# Patient Record
Sex: Male | Born: 1950
Health system: Southern US, Community
[De-identification: ages and names within clinical notes are randomized; demographics above are authoritative.]

## PROBLEM LIST (undated history)

## (undated) DIAGNOSIS — Z87442 Personal history of urinary calculi: Secondary | ICD-10-CM

## (undated) DIAGNOSIS — E7800 Pure hypercholesterolemia, unspecified: Secondary | ICD-10-CM

## (undated) DIAGNOSIS — M1990 Unspecified osteoarthritis, unspecified site: Secondary | ICD-10-CM

## (undated) DIAGNOSIS — K219 Gastro-esophageal reflux disease without esophagitis: Secondary | ICD-10-CM

## (undated) DIAGNOSIS — A6000 Herpesviral infection of urogenital system, unspecified: Secondary | ICD-10-CM

## (undated) DIAGNOSIS — I10 Essential (primary) hypertension: Secondary | ICD-10-CM

## (undated) HISTORY — PX: VASECTOMY: SHX75

## (undated) HISTORY — DX: Unspecified osteoarthritis, unspecified site: M19.90

## (undated) HISTORY — DX: Gastro-esophageal reflux disease without esophagitis: K21.9

## (undated) HISTORY — DX: Herpesviral infection of urogenital system, unspecified: A60.00

## (undated) HISTORY — DX: Essential (primary) hypertension: I10

---

## 2004-05-27 ENCOUNTER — Encounter: Admission: RE | Admit: 2004-05-27 | Discharge: 2004-05-27 | Payer: Self-pay | Admitting: Family Medicine

## 2005-11-10 IMAGING — CR DG WRIST COMPLETE 3+V*L*
4 series · 4 of 4 positions shown · non-contrast
Comparison: none

CLINICAL DATA: Left wrist pain after exercise injury.
LEFT WRIST COMPLETE (FOUR VIEWS):
Four views of the left wrist show no definite fracture, dislocation or radiopaque foreign body.  iews of the wrist, as well as a special navicular view were made.
IMPRESSION
No fracture or dislocation left wrist.  These plain films do not rule out ligamentous injury.

[view not recorded (1 of 4)]
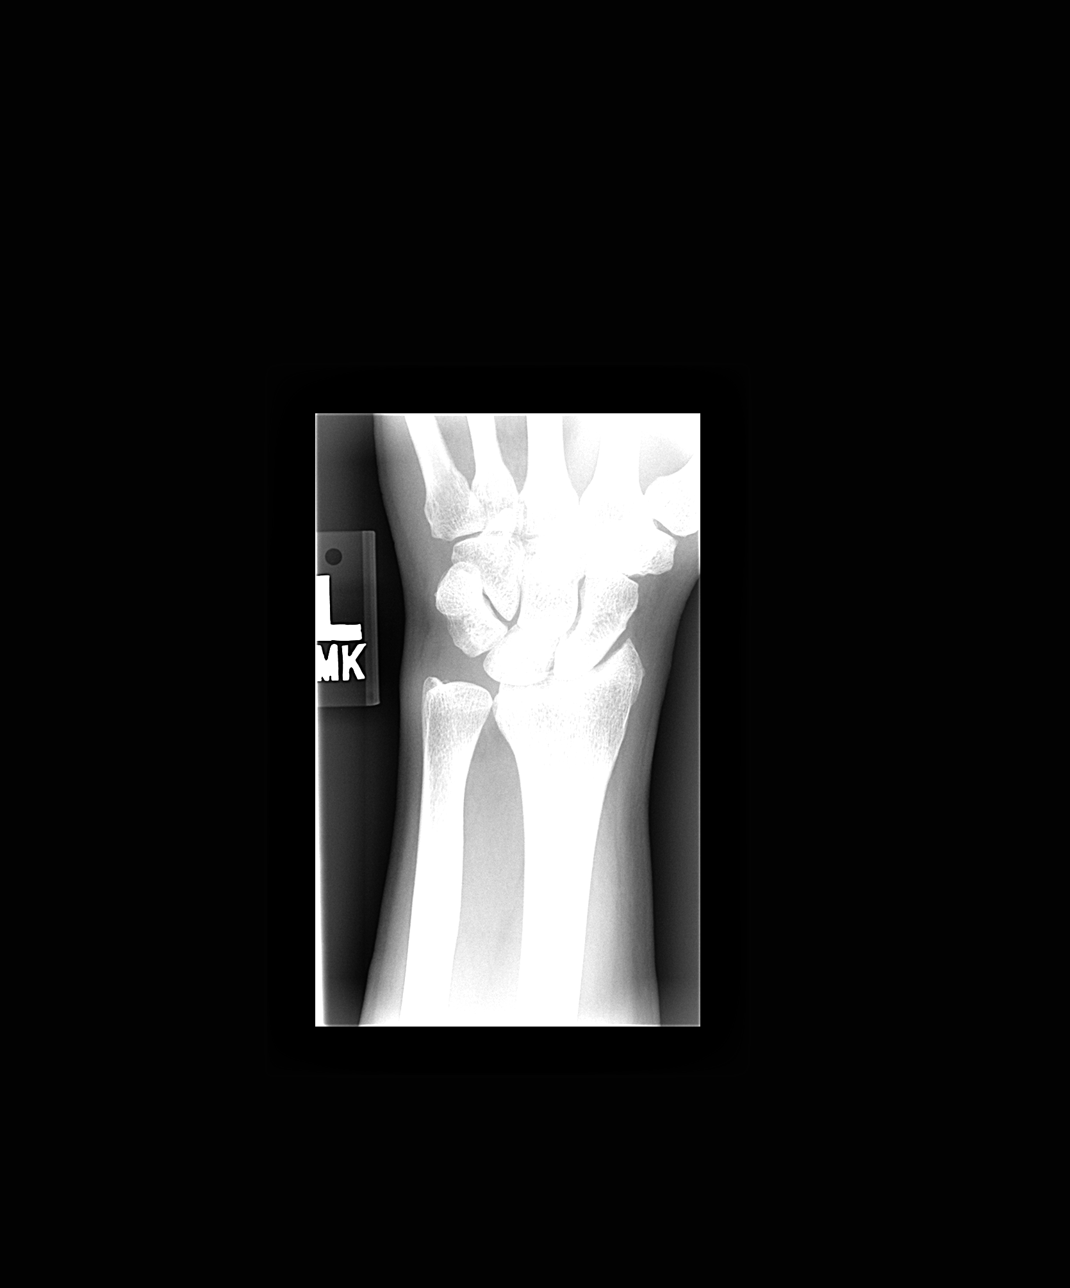

[view not recorded (2 of 4)]
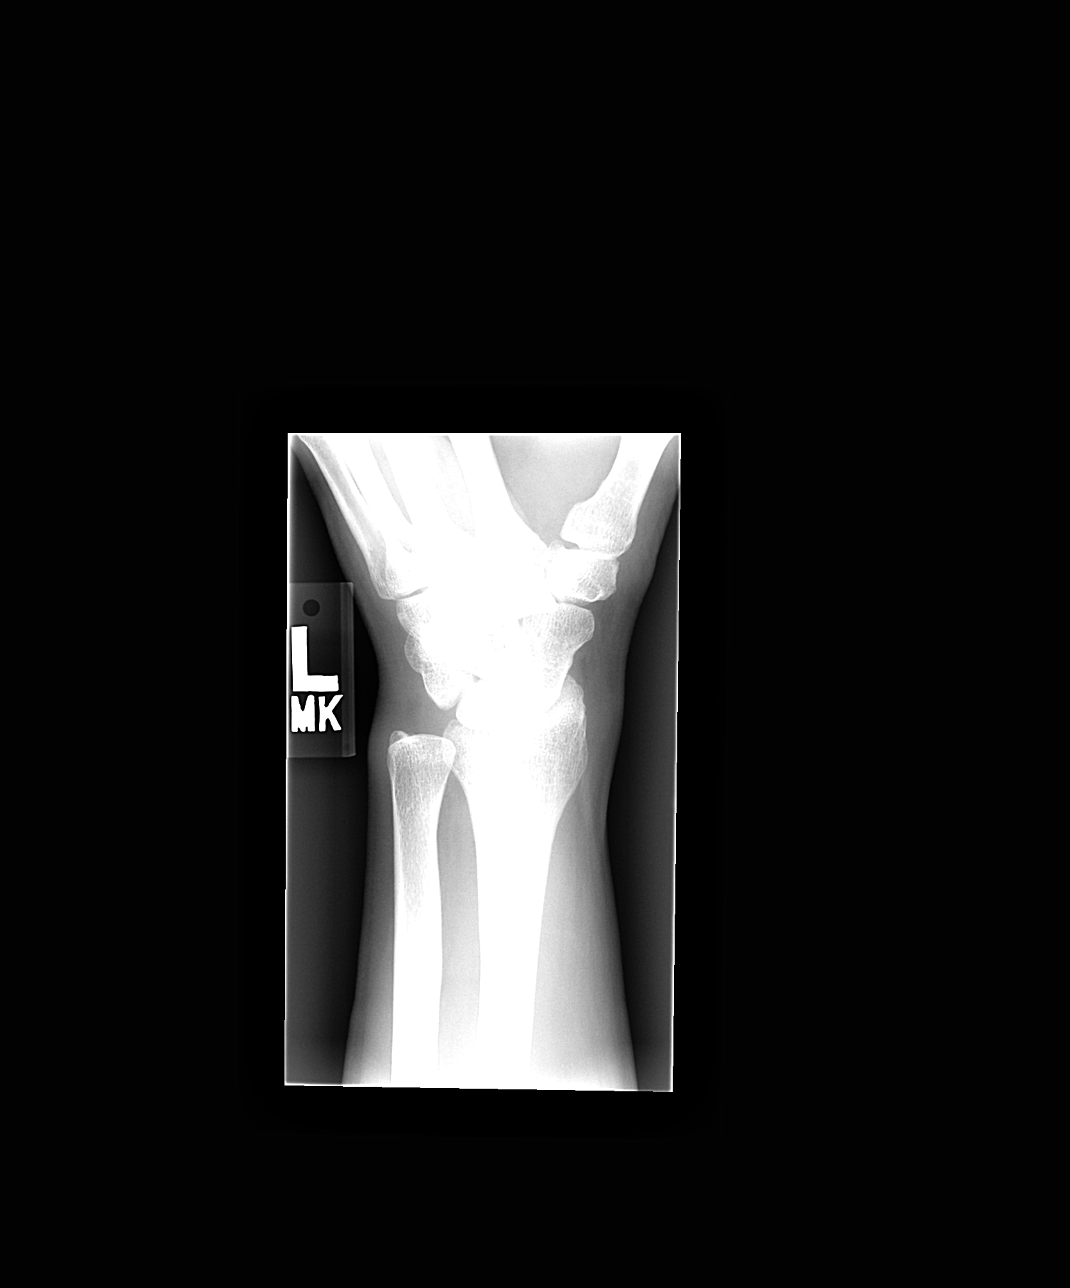

[view not recorded (3 of 4)]
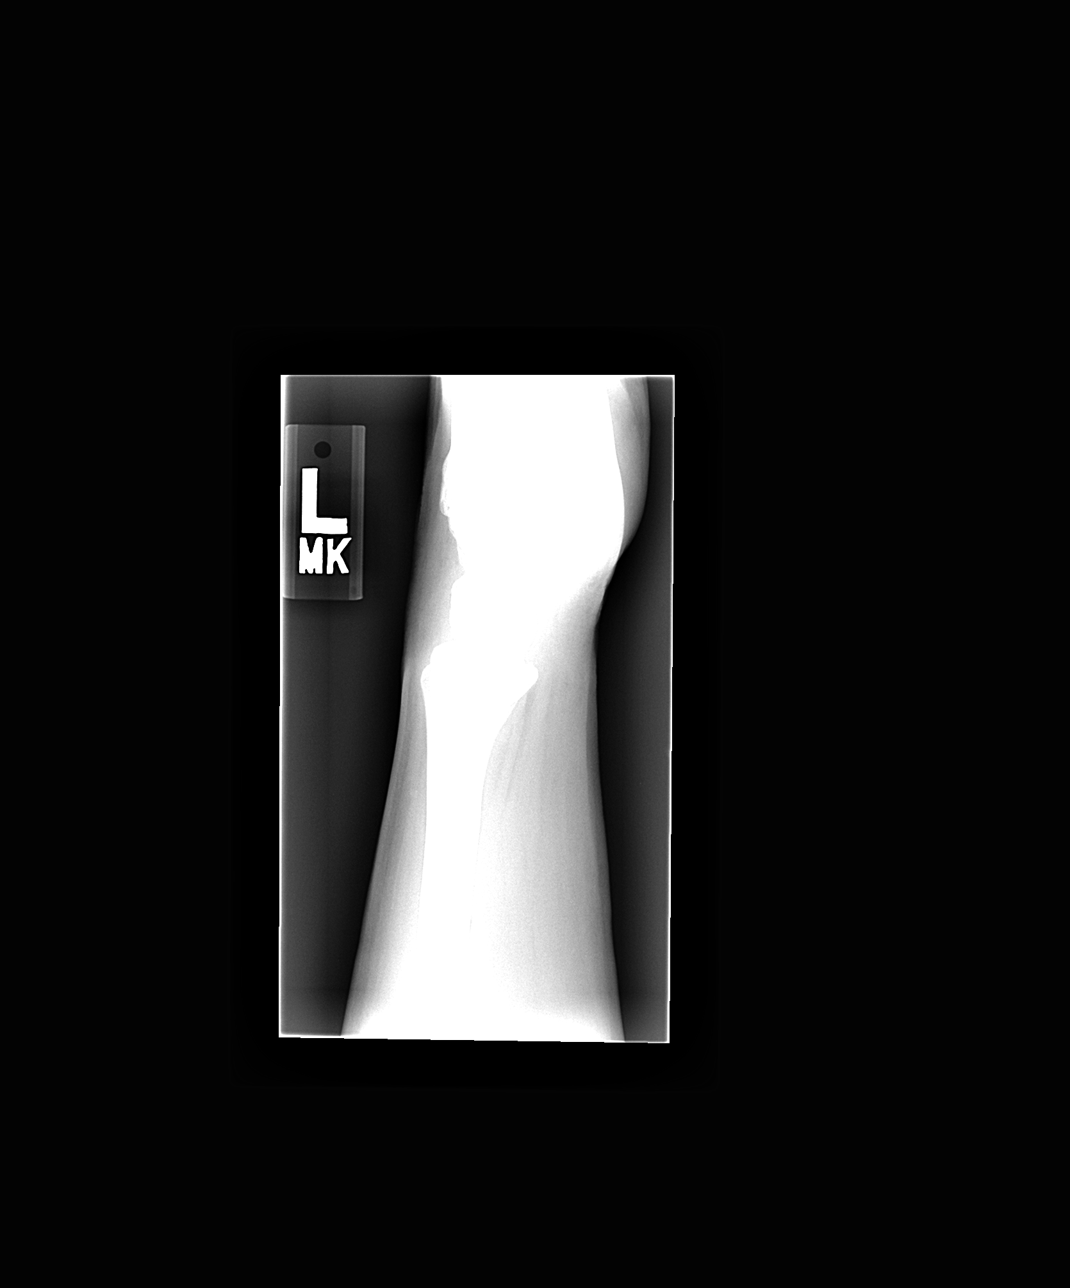

[view not recorded (4 of 4)]
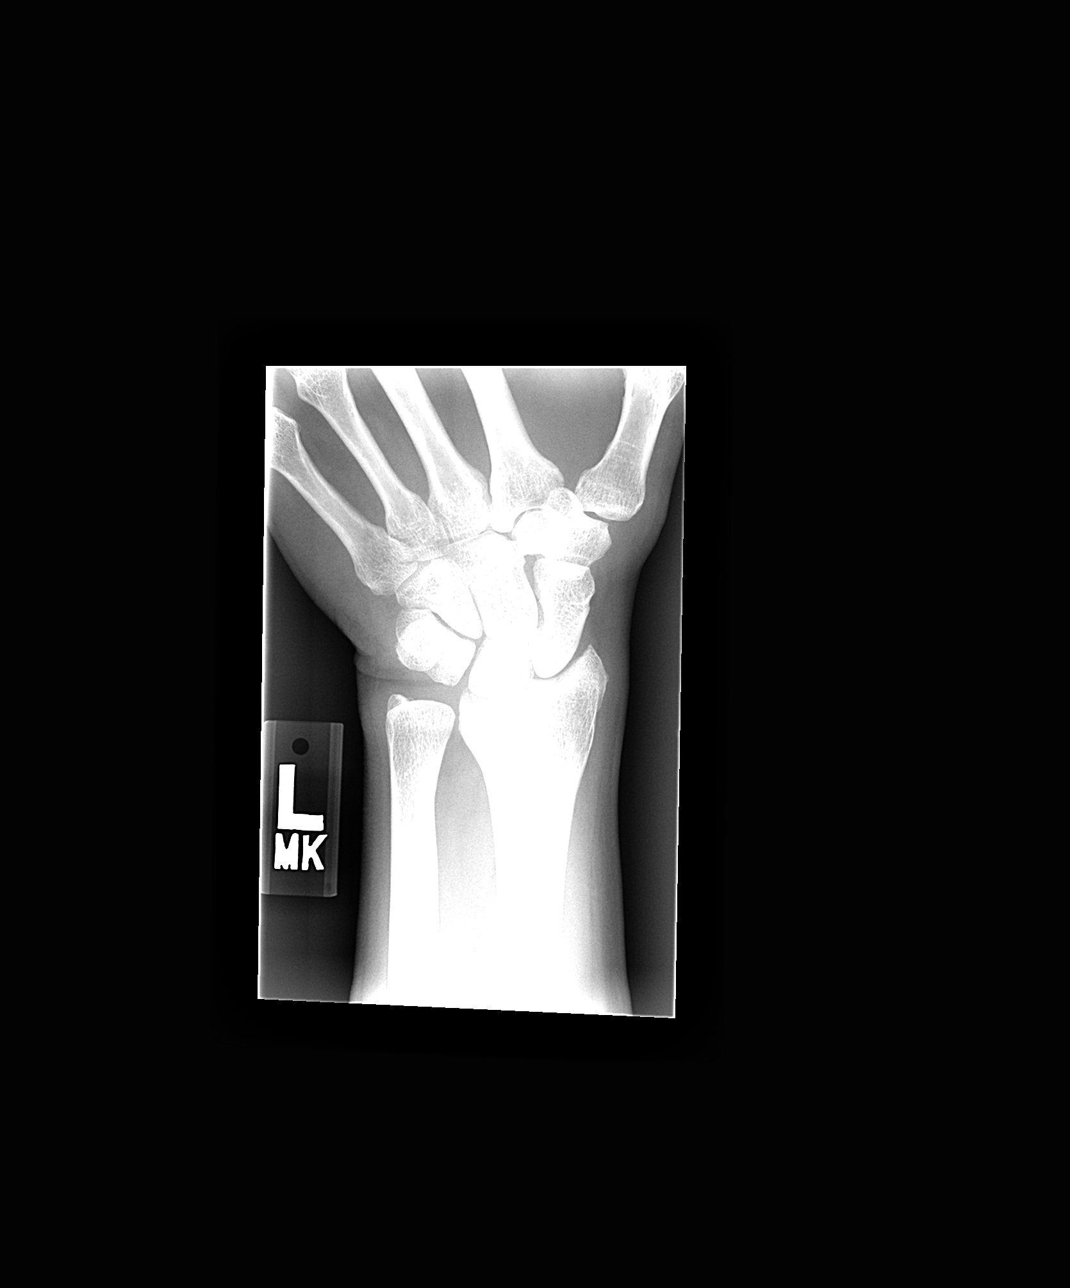

[4 of 4 positions shown; findings below may reference images not displayed]

## 2011-11-23 ENCOUNTER — Ambulatory Visit (INDEPENDENT_AMBULATORY_CARE_PROVIDER_SITE_OTHER): Payer: BC Managed Care – PPO

## 2011-11-23 DIAGNOSIS — Z23 Encounter for immunization: Secondary | ICD-10-CM

## 2011-11-23 DIAGNOSIS — Z111 Encounter for screening for respiratory tuberculosis: Secondary | ICD-10-CM

## 2011-11-26 ENCOUNTER — Ambulatory Visit (INDEPENDENT_AMBULATORY_CARE_PROVIDER_SITE_OTHER): Payer: BC Managed Care – PPO

## 2011-11-26 DIAGNOSIS — Z0389 Encounter for observation for other suspected diseases and conditions ruled out: Secondary | ICD-10-CM

## 2012-02-02 ENCOUNTER — Ambulatory Visit (INDEPENDENT_AMBULATORY_CARE_PROVIDER_SITE_OTHER): Payer: BC Managed Care – PPO | Admitting: Internal Medicine

## 2012-02-02 DIAGNOSIS — Z111 Encounter for screening for respiratory tuberculosis: Secondary | ICD-10-CM

## 2012-02-02 NOTE — Progress Notes (Signed)
  Subjective:    Patient ID: Daniel Lamb, male    DOB: 1950/12/13, 61 y.o.   MRN: [id number]  HPI Pt is here for a 2 step PPD placement.  He has never had a positive PPD   Review of Systems     Objective:   Physical Exam        Assessment & Plan:  PPD placed.  RTC in 48-72 hours.  Return in 2-3 week for second PPD placement.

## 2012-02-02 NOTE — Patient Instructions (Signed)
Pt needs to return in 48-72 hours.  His second step PPD will be placed in 2-3 weeks from today

## 2012-02-04 ENCOUNTER — Encounter (INDEPENDENT_AMBULATORY_CARE_PROVIDER_SITE_OTHER): Payer: BC Managed Care – PPO

## 2012-02-04 DIAGNOSIS — Z0389 Encounter for observation for other suspected diseases and conditions ruled out: Secondary | ICD-10-CM

## 2012-02-11 ENCOUNTER — Ambulatory Visit (INDEPENDENT_AMBULATORY_CARE_PROVIDER_SITE_OTHER): Payer: BC Managed Care – PPO | Admitting: Internal Medicine

## 2012-02-11 DIAGNOSIS — Z111 Encounter for screening for respiratory tuberculosis: Secondary | ICD-10-CM

## 2012-02-11 MED ORDER — TUBERCULIN PPD 5 UNIT/0.1ML ID SOLN: 5.0000 [IU] | Freq: Once | INTRADERMAL | Status: DC

## 2012-02-13 ENCOUNTER — Encounter (INDEPENDENT_AMBULATORY_CARE_PROVIDER_SITE_OTHER): Payer: BC Managed Care – PPO

## 2012-02-13 DIAGNOSIS — Z0389 Encounter for observation for other suspected diseases and conditions ruled out: Secondary | ICD-10-CM

## 2012-08-14 ENCOUNTER — Other Ambulatory Visit: Payer: Self-pay | Admitting: Family Medicine

## 2012-08-14 NOTE — Telephone Encounter (Signed)
Last seen 07/2011 for CPE-- can we refill or is OV needed?

## 2013-02-01 DIAGNOSIS — Z0271 Encounter for disability determination: Secondary | ICD-10-CM

## 2013-03-07 ENCOUNTER — Ambulatory Visit (INDEPENDENT_AMBULATORY_CARE_PROVIDER_SITE_OTHER): Payer: BC Managed Care – PPO | Admitting: Physician Assistant

## 2013-03-07 VITALS — BP 132/84 | HR 50 | Temp 98.1°F | Resp 16 | Ht 65.5 in | Wt 158.0 lb

## 2013-03-07 DIAGNOSIS — A6000 Herpesviral infection of urogenital system, unspecified: Secondary | ICD-10-CM

## 2013-03-07 MED ORDER — VALACYCLOVIR HCL 500 MG PO TABS: ORAL_TABLET | ORAL | Status: DC

## 2013-03-07 NOTE — Progress Notes (Signed)
   Patient ID: Daniel Lamb MRN: [id number], DOB: 1951-06-19, 62 y.o. Date of Encounter: 03/07/2013, 4:09 PM  Primary Physician: Elvina Sidle, MD  Chief Complaint: Medication refill   HPI: 61 y.o. male with history below presents for refill of Valtrex.Doing well without issues or complaints. Has not taken medication for about 1.5-2 years. Last flare was about this same time. Was on suppressive therapy at that time. Noticed a erythematous rash along his pubis the previous day. Generally healthy. 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     History reviewed. No pertinent past medical history.   Home Meds: Prior to Admission medications   Medication Sig Start Date End Date Taking? Authorizing Provider  valACYclovir (VALTREX) 500 MG tablet Take 1 tab twice daily for 3 days for acute outbreak. Then go back to 2 tabs daily for suppression. 03/07/13  No Sondra Barges, PA-C    Allergies: No Known Allergies  History   Social History  . Marital Status: Married    Spouse Name: N/A    Number of Children: N/A  . Years of Education: N/A   Occupational History  . Not on file.   Social History Main Topics  . Smoking status: Never Smoker   . Smokeless tobacco: Not on file  . Alcohol Use: Yes  . Drug Use: No  . Sexually Active: Not on file   Other Topics Concern  . Not on file   Social History Narrative  . No narrative on file     Review of Systems: Constitutional: negative for chills, fever, night sweats, weight changes, or fatigue  Cardiovascular: negative for chest pain or palpitations Respiratory: negative for hemoptysis, wheezing, shortness of breath, or cough Dermatological: positive for rash   Physical Exam: Blood pressure 132/84, pulse 50, temperature 98.1 F (36.7 C), temperature source Oral, resp. rate 16, height 5' 5.5" (1.664 m), weight 158 lb (71.668 kg), SpO2 98.00%., Body mass index is 25.88 kg/(m^2). General: Well developed, well nourished, in no acute  distress. Head: Normocephalic, atraumatic, eyes without discharge, sclera non-icteric, nares are without discharge. Bilateral auditory canals clear, TM's are without perforation, pearly grey and translucent with reflective cone of light bilaterally. Oral cavity moist, posterior pharynx without exudate, erythema, peritonsillar abscess, or post nasal drip.  Neck: Supple. No thyromegaly. Full ROM. No lymphadenopathy. Lungs: Clear bilaterally to auscultation without wheezes, rales, or rhonchi. Breathing is unlabored. Heart: RRR with S1 S2. No murmurs, rubs, or gallops appreciated. Msk:  Strength and tone normal for age. Extremities/Skin: Warm and dry. No clubbing or cyanosis. No edema. Erythematous vesicles along pubis.  Neuro: Alert and oriented X 3. Moves all extremities spontaneously. Gait is normal. CNII-XII grossly in tact. Psych:  Responds to questions appropriately with a normal affect.     ASSESSMENT AND PLAN:  62 y.o. male with genital herpes here for medication refill of Valtrex. -Acute outbreak that began the previous day -Valtrex 500 mg 1 tab bid for 3 days if develops acute outbreak. Then go back to 2 tabs daily for suppression. #60 RF 11 -Follow up prn  Signed, Eula Listen, PA-C 03/07/2013 4:09 PM

## 2013-12-01 ENCOUNTER — Ambulatory Visit (INDEPENDENT_AMBULATORY_CARE_PROVIDER_SITE_OTHER): Payer: BC Managed Care – PPO | Admitting: Physician Assistant

## 2013-12-01 VITALS — BP 118/74 | HR 67 | Temp 98.7°F | Resp 18 | Ht 66.0 in | Wt 155.0 lb

## 2013-12-01 DIAGNOSIS — J209 Acute bronchitis, unspecified: Secondary | ICD-10-CM

## 2013-12-01 MED ORDER — HYDROCOD POLST-CHLORPHEN POLST 10-8 MG/5ML PO LQCR: 5.0000 mL | Freq: Two times a day (BID) | ORAL | Status: AC

## 2013-12-01 MED ORDER — AZITHROMYCIN 250 MG PO TABS: ORAL_TABLET | ORAL | Status: AC

## 2013-12-01 NOTE — Progress Notes (Signed)
   Subjective:    Patient ID: Daniel Lamb, male    DOB: November 03, 1951, 62 y.o.   MRN: [id number]  HPI Pt presents to clinic with 1 week h/o cold symptoms.  Started as a cold with nasal congestion that has improved but his cough continues.  It is a deep cough that has green sputum at times.  The cough really only happens at night when he lays down.  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  He wants to make sure he is ok to be at work with this illness.  He is not SOB and has no h/o asthma and is not a smoker.    OTC meds - mucinex No flu vaccine No sick contacts Review of Systems  Constitutional: Positive for fever (low grade) and chills.  HENT: Positive for congestion, postnasal drip and sore throat (from the cough). Negative for rhinorrhea.   Respiratory: Positive for cough (green).   Musculoskeletal: Positive for myalgias.  Neurological: Negative for headaches.  Psychiatric/Behavioral: Positive for sleep disturbance (2nd to cough).       Objective:   Physical Exam  Vitals reviewed. Constitutional: He is oriented to person, place, and time. He appears well-developed and well-nourished.  HENT:  Head: Normocephalic and atraumatic.  Right Ear: Hearing, tympanic membrane, external ear and ear canal normal.  Left Ear: Hearing, tympanic membrane, external ear and ear canal normal.  Nose: Mucosal edema (red) present.  Mouth/Throat: Uvula is midline, oropharynx is clear and moist and mucous membranes are normal.  Eyes: Conjunctivae are normal.  Neck: Normal range of motion.  Cardiovascular: Normal rate, regular rhythm and normal heart sounds.   No murmur heard. Pulmonary/Chest: Effort normal and breath sounds normal.  coarse breath sounds that clear after a cough - no wheezing with auscultation exam  Lymphadenopathy:    He has no cervical adenopathy.  Neurological: He is alert and oriented to person, place, and time.  Skin: Skin  is warm and dry.  Psychiatric: He has a normal mood and affect. His behavior is normal. Judgment and thought content normal.       Assessment & Plan:  Acute bronchitis - Plan: azithromycin (ZITHROMAX Z-PAK) 250 MG tablet, chlorpheniramine-HYDROcodone (TUSSIONEX PENNKINETIC ER) 10-8 MG/5ML LQCR  Pt to continue Mucinex.  He may want to wait for a day or so and take the cough medication to see if sleeping will help and then he will take the abx.  He will push fluids.  He is ok to work.  Benny Lennert PA-C 12/01/2013 1:46 PM

## 2014-02-02 ENCOUNTER — Ambulatory Visit (INDEPENDENT_AMBULATORY_CARE_PROVIDER_SITE_OTHER): Payer: Self-pay | Admitting: Family Medicine

## 2014-02-02 VITALS — BP 122/78 | HR 75 | Temp 99.7°F | Resp 17 | Ht 65.5 in | Wt 159.0 lb

## 2014-02-02 DIAGNOSIS — R05 Cough: Secondary | ICD-10-CM

## 2014-02-02 DIAGNOSIS — J111 Influenza due to unidentified influenza virus with other respiratory manifestations: Secondary | ICD-10-CM

## 2014-02-02 DIAGNOSIS — J101 Influenza due to other identified influenza virus with other respiratory manifestations: Secondary | ICD-10-CM

## 2014-02-02 DIAGNOSIS — R6889 Other general symptoms and signs: Secondary | ICD-10-CM

## 2014-02-02 DIAGNOSIS — R059 Cough, unspecified: Secondary | ICD-10-CM

## 2014-02-02 LAB — POCT INFLUENZA A/B
INFLUENZA A, POC: NEGATIVE
Influenza B, POC: POSITIVE

## 2014-02-02 MED ORDER — HYDROCODONE-HOMATROPINE 5-1.5 MG/5ML PO SYRP: 5.0000 mL | ORAL_SOLUTION | ORAL | Status: DC | PRN

## 2014-02-02 MED ORDER — BENZONATATE 100 MG PO CAPS: 100.0000 mg | ORAL_CAPSULE | Freq: Three times a day (TID) | ORAL | Status: DC | PRN

## 2014-02-02 NOTE — Patient Instructions (Signed)
Influenza, Adult Influenza ("the flu") is a viral infection of the respiratory tract. It occurs more often in winter months because people spend more time in close contact with one another. Influenza can make you feel very sick. Influenza easily spreads from person to person (contagious). CAUSES  Influenza is caused by a virus that infects the respiratory tract. You can catch the virus by breathing in droplets from an infected person's cough or sneeze. You can also catch the virus by touching something that was recently contaminated with the virus and then touching your mouth, nose, or eyes. SYMPTOMS  Symptoms typically last 4 to 10 days and may include:  Fever.  Chills.  Headache, body aches, and muscle aches.  Sore throat.  Chest discomfort and cough.  Poor appetite.  Weakness or feeling tired.  Dizziness.  Nausea or vomiting. DIAGNOSIS  Diagnosis of influenza is often made based on your history and a physical exam. A nose or throat swab test can be done to confirm the diagnosis. RISKS AND COMPLICATIONS You may be at risk for a more severe case of influenza if you smoke cigarettes, have diabetes, have chronic heart disease (such as heart failure) or lung disease (such as asthma), or if you have a weakened immune system. Elderly people and pregnant women are also at risk for more serious infections. The most common complication of influenza is a lung infection (pneumonia). Sometimes, this complication can require emergency medical care and may be life-threatening. PREVENTION  An annual influenza vaccination (flu shot) is the best way to avoid getting influenza. An annual flu shot is now routinely recommended for all adults in the [place] TREATMENT  In mild cases, influenza goes away on its own. Treatment is directed at relieving symptoms. For more severe cases, your caregiver may prescribe antiviral medicines to shorten the sickness. Antibiotic medicines are not effective, because the  infection is caused by a virus, not by bacteria. HOME CARE INSTRUCTIONS  Only take over-the-counter or prescription medicines for pain, discomfort, or fever as directed by your caregiver.  Use a cool mist humidifier to make breathing easier.  Get plenty of rest until your temperature returns to normal. This usually takes 3 to 4 days.  Drink enough fluids to keep your urine clear or pale yellow.  Cover your mouth and nose when coughing or sneezing, and wash your hands well to avoid spreading the virus.  Stay home from work or school until your fever has been gone for at least 1 full day. SEEK MEDICAL CARE IF:   You have chest pain or a deep cough that worsens or produces more mucus.  You have nausea, vomiting, or diarrhea. SEEK IMMEDIATE MEDICAL CARE IF:   You have difficulty breathing, shortness of breath, or your skin or nails turn bluish.  You have severe neck pain or stiffness.  You have a severe headache, facial pain, or earache.  You have a worsening or recurring fever.  You have nausea or vomiting that cannot be controlled. MAKE SURE YOU:  Understand these instructions.  Will watch your condition.  Will get help right away if you are not doing well or get worse. Document Released: 11/17/2000 Document Revised: 05/21/2012 Document Reviewed: 02/19/2012 [facility] Patient Information 2014 [place], [place].   Hycodan 1 teaspoon every 4-6 hours as needed for cough  Tessalon 1-2 every 6 or 8 hours as needed for cough

## 2014-02-02 NOTE — Progress Notes (Signed)
Subjective: Patient is here with history of having just flown in from [facility]. He started getting sick yesterday with some cough. He has not had any documented fever. He is not coughing up red stuff. He feels ill and has a little sniffles. The throat is only mildly sore when he coughs. He has body aches.  Objective: Temperature 99.7. His TMs are normal. Throat clear. Neck supple without significant nodes. Chest is clear to auscultation. Heart regular without murmurs.  Assessment: Flulike illness  Plan: Influenza swab  Results for orders placed in visit on 02/02/14  POCT INFLUENZA A/B      Result Value Ref Range   Influenza A, POC Negative     Influenza B, POC Positive

## 2014-03-21 ENCOUNTER — Other Ambulatory Visit: Payer: Self-pay | Admitting: Physician Assistant

## 2014-03-23 NOTE — Telephone Encounter (Signed)
Has been a year since pt was seen for this. Only other acute issues since. Can we give RFs?

## 2015-01-11 ENCOUNTER — Ambulatory Visit (INDEPENDENT_AMBULATORY_CARE_PROVIDER_SITE_OTHER): Payer: BLUE CROSS/BLUE SHIELD | Admitting: Family Medicine

## 2015-01-11 VITALS — BP 144/88 | HR 68 | Temp 98.2°F | Resp 16 | Ht 66.0 in | Wt 161.4 lb

## 2015-01-11 DIAGNOSIS — J029 Acute pharyngitis, unspecified: Secondary | ICD-10-CM

## 2015-01-11 DIAGNOSIS — R52 Pain, unspecified: Secondary | ICD-10-CM

## 2015-01-11 DIAGNOSIS — J069 Acute upper respiratory infection, unspecified: Secondary | ICD-10-CM

## 2015-01-11 DIAGNOSIS — B9789 Other viral agents as the cause of diseases classified elsewhere: Secondary | ICD-10-CM

## 2015-01-11 LAB — POCT RAPID STREP A (OFFICE): Rapid Strep A Screen: NEGATIVE

## 2015-01-11 LAB — POCT INFLUENZA A/B
Influenza A, POC: NEGATIVE
Influenza B, POC: NEGATIVE

## 2015-01-11 NOTE — Progress Notes (Signed)
Chief Complaint:  Chief Complaint  Patient presents with  . Cough    HPI: Daniel Lamb is a 64 y.o. 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  He has not been around any children   SpO2 Readings from Last 3 Encounters:  01/11/15 98%  02/02/14 97%  12/01/13 97%     History reviewed. No pertinent past medical history. Past Surgical History  Procedure Laterality Date  . Vasectomy     History   Social History  . Marital Status: Married    Spouse Name: N/A    Number of Children: N/A  . Years of Education: N/A   Social History Main Topics  . Smoking status: Never Smoker   . Smokeless tobacco: None  . Alcohol Use: Yes  . Drug Use: No  . Sexual Activity: None   Other Topics Concern  . None   Social History Narrative   Family History  Problem Relation Age of Onset  . Cancer Mother   . Cancer Father    No Known Allergies Prior to Admission medications   Medication Sig Start Date End Date Taking? Authorizing Provider  valACYclovir (VALTREX) 500 MG tablet TAKE 1 TABLET BY MOUTH TWICE A DAY FOR 3 DAYS FOR ACUTE OUTBREAK THEN GO BACK TO 2 TABLETS DAILY FOR SUPPRESSION   Yes Chelle S Jeffery, PA-C     ROS: The patient denies fevers, chills, night sweats, unintentional weight loss, chest pain, palpitations, wheezing, dyspnea on exertion, nausea, vomiting, abdominal pain, dysuria, hematuria, melena, numbness, weakness, or tingling.   All other systems have been reviewed and were otherwise negative with the exception of those mentioned in the HPI and as above.    PHYSICAL EXAM: Filed Vitals:   01/11/15 1359  BP: 144/88  Pulse: 68  Temp: 98.2 F (36.8 C)  Resp: 16   Filed Vitals:   01/11/15 1359  Height: 5\' 6"  (1.676 m)    Weight: 161 lb 6.4 oz (73.211 kg)   Body mass index is 26.06 kg/(m^2).  General: Alert, no acute distress HEENT:  Normocephalic, atraumatic, oropharynx patent. EOMI, PERRLA Erythematous throat, no exudates, TM normal, +/- sinus tenderness, + erythematous/boggy nasal mucosa Cardiovascular:  Regular rate and rhythm, no rubs murmurs or gallops.  No Carotid bruits, radial pulse intact. No pedal edema.  Respiratory: Clear to auscultation bilaterally.  No wheezes, rales, or rhonchi.  No cyanosis, no use of accessory musculature GI: No organomegaly, abdomen is soft and non-tender, positive bowel sounds.  No masses. Skin: No rashes. Neurologic: Facial musculature symmetric. Psychiatric: Patient is appropriate throughout our interaction. Lymphatic: No cervical lymphadenopathy Musculoskeletal: Gait intact.   LABS: Results for orders placed or performed in visit on 01/11/15  POCT rapid strep A  Result Value Ref Range   Rapid Strep A Screen Negative Negative  POCT Influenza A/B  Result Value Ref Range   Influenza A, POC Negative    Influenza B, POC Negative      EKG/XRAY:   Primary read interpreted by Dr. Marin Comment at [facility].   ASSESSMENT/PLAN: Encounter Diagnoses  Name Primary?  . Acute pharyngitis, unspecified pharyngitis type Yes  . Body aches   . Viral URI with cough  OTC treatment Most likely viral, if sxs worsen he will let me know F/u prn    Gross sideeffects, risk and benefits, and alternatives of medications d/w patient. Patient is aware that all medications have potential sideeffects and we are unable to predict every sideeffect or drug-drug interaction that may occur.  Alyssamarie Mounsey, [place], DO 01/11/2015 3:24 PM

## 2015-04-30 ENCOUNTER — Other Ambulatory Visit: Payer: Self-pay | Admitting: Physician Assistant

## 2015-05-19 ENCOUNTER — Ambulatory Visit (INDEPENDENT_AMBULATORY_CARE_PROVIDER_SITE_OTHER): Payer: BLUE CROSS/BLUE SHIELD | Admitting: Family Medicine

## 2015-05-19 VITALS — BP 142/80 | HR 56 | Temp 98.1°F | Resp 16 | Ht 66.2 in | Wt 156.8 lb

## 2015-05-19 DIAGNOSIS — B356 Tinea cruris: Secondary | ICD-10-CM | POA: Diagnosis not present

## 2015-05-19 MED ORDER — KETOCONAZOLE 2 % EX CREA: 1.0000 "application " | TOPICAL_CREAM | Freq: Two times a day (BID) | CUTANEOUS | Status: DC

## 2015-05-19 NOTE — Progress Notes (Signed)
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  Exercise makes it worse.  Objective:  NAD BP 142/80 mmHg  Pulse 56  Temp(Src) 98.1 F (36.7 C) (Oral)  Resp 16  Ht 5' 6.2" (1.681 m)  Wt 156 lb 12.8 oz (71.124 kg)  BMI 25.17 kg/m2  SpO2 98% Inguinal erythema and scaling with mild hyperpigmentation     ICD-9-CM ICD-10-CM   1. Tinea cruris 110.3 B35.6 ketoconazole (NIZORAL) 2 % cream     Signed, Robyn Haber, MD

## 2015-05-19 NOTE — Patient Instructions (Signed)
Jock Itch Jock itch is a fungal infection of the skin in the groin area. It is sometimes called "ringworm" even though it is not caused by a worm. A fungus is a type of germ that thrives in dark, damp places.  CAUSES  This infection may spread from:  A fungus infection elsewhere on the body (such as athlete's foot).  Sharing towels or clothing. This infection is more common in:  Hot, humid climates.  People who wear tight-fitting clothing or wet bathing suits for long periods of time.  Athletes.  Overweight people.  People with diabetes. SYMPTOMS  Jock itch causes the following symptoms:  Red, pink or brown rash in the groin. Rash may spread to the thighs, anus, and buttocks.  Itching. DIAGNOSIS  Your caregiver may make the diagnosis by looking at the rash. Sometimes a skin scraping will be sent to test for fungus. Testing can be done either by looking under the microscope or by doing a culture (test to try to grow the fungus). A culture can take up to 2 weeks to come back. TREATMENT  Jock itch may be treated with:  Skin cream or ointment to kill fungus.  Medicine by mouth to kill fungus.  Skin cream or ointment to calm the itching.  Compresses or medicated powders to dry the infected skin. HOME CARE INSTRUCTIONS   Be sure to treat the rash completely. Follow your caregiver's instructions. It can take a couple of weeks to treat. If you do not treat the infection long enough, the rash can come back.  Wear loose-fitting clothing.  Men should wear cotton boxer shorts.  Women should wear cotton underwear.  Avoid hot baths.  Dry the groin area well after bathing. SEEK MEDICAL CARE IF:   Your rash is worse.  Your rash is spreading.  Your rash returns after treatment is finished.  Your rash is not gone in 4 weeks. Fungal infections are slow to respond to treatment. Some redness may remain for several weeks after the fungus is gone. SEEK IMMEDIATE MEDICAL CARE  IF:  The area becomes red, warm, tender, and swollen.  You have a fever. Document Released: 11/10/2002 Document Revised: 02/12/2012 Document Reviewed: 10/09/2008 [employer] Patient Information 2015 [employer]. This information is not intended to replace advice given to you by your health care provider. Make sure you discuss any questions you have with your health care provider.  

## 2015-06-28 ENCOUNTER — Ambulatory Visit (INDEPENDENT_AMBULATORY_CARE_PROVIDER_SITE_OTHER): Payer: BLUE CROSS/BLUE SHIELD | Admitting: Physician Assistant

## 2015-06-28 VITALS — BP 140/78 | HR 66 | Temp 97.9°F | Resp 18 | Ht 65.0 in | Wt 158.2 lb

## 2015-06-28 DIAGNOSIS — Z8619 Personal history of other infectious and parasitic diseases: Secondary | ICD-10-CM | POA: Insufficient documentation

## 2015-06-28 DIAGNOSIS — B356 Tinea cruris: Secondary | ICD-10-CM

## 2015-06-28 DIAGNOSIS — Z5181 Encounter for therapeutic drug level monitoring: Secondary | ICD-10-CM | POA: Diagnosis not present

## 2015-06-28 LAB — COMPREHENSIVE METABOLIC PANEL
ALK PHOS: 38 U/L — AB (ref 40–115)
ALT: 22 U/L (ref 9–46)
AST: 33 U/L (ref 10–35)
Albumin: 4.6 g/dL (ref 3.6–5.1)
BILIRUBIN TOTAL: 0.8 mg/dL (ref 0.2–1.2)
BUN: 17 mg/dL (ref 7–25)
CO2: 26 mmol/L (ref 20–31)
Calcium: 9.4 mg/dL (ref 8.6–10.3)
Chloride: 101 mmol/L (ref 98–110)
Creat: 0.98 mg/dL (ref 0.70–1.25)
Glucose, Bld: 86 mg/dL (ref 65–99)
Potassium: 4.1 mmol/L (ref 3.5–5.3)
SODIUM: 141 mmol/L (ref 135–146)
Total Protein: 7.4 g/dL (ref 6.1–8.1)

## 2015-06-28 LAB — POCT SKIN KOH: SKIN KOH, POC: NEGATIVE

## 2015-06-28 MED ORDER — TERBINAFINE HCL 250 MG PO TABS: 250.0000 mg | ORAL_TABLET | Freq: Every day | ORAL | Status: DC

## 2015-06-28 MED ORDER — VALACYCLOVIR HCL 500 MG PO TABS: ORAL_TABLET | ORAL | Status: DC

## 2015-06-28 NOTE — Patient Instructions (Addendum)
I've refilled your valtrex. Please take the lamisil once daily for the next 2 weeks. We are checking your liver today to ensure it's ok to take this medication.  This should work to resolve the fungal infection. Please come back to see [place] if the infection persists.   Jock Itch Jock itch is a fungal infection of the skin in the groin area. It is sometimes called "ringworm" even though it is not caused by a worm. A fungus is a type of germ that thrives in dark, damp places.  CAUSES  This infection may spread from:  A fungus infection elsewhere on the body (such as athlete's foot).  Sharing towels or clothing. This infection is more common in:  Hot, humid climates.  People who wear tight-fitting clothing or wet bathing suits for long periods of time.  Athletes.  Overweight people.  People with diabetes. SYMPTOMS  Jock itch causes the following symptoms:  Red, pink or brown rash in the groin. Rash may spread to the thighs, anus, and buttocks.  Itching. DIAGNOSIS  Your caregiver may make the diagnosis by looking at the rash. Sometimes a skin scraping will be sent to test for fungus. Testing can be done either by looking under the microscope or by doing a culture (test to try to grow the fungus). A culture can take up to 2 weeks to come back. TREATMENT  Jock itch may be treated with:  Skin cream or ointment to kill fungus.  Medicine by mouth to kill fungus.  Skin cream or ointment to calm the itching.  Compresses or medicated powders to dry the infected skin. HOME CARE INSTRUCTIONS   Be sure to treat the rash completely. Follow your caregiver's instructions. It can take a couple of weeks to treat. If you do not treat the infection long enough, the rash can come back.  Wear loose-fitting clothing.  Men should wear cotton boxer shorts.  Women should wear cotton underwear.  Avoid hot baths.  Dry the groin area well after bathing. SEEK MEDICAL CARE IF:   Your rash is  worse.  Your rash is spreading.  Your rash returns after treatment is finished.  Your rash is not gone in 4 weeks. Fungal infections are slow to respond to treatment. Some redness may remain for several weeks after the fungus is gone. SEEK IMMEDIATE MEDICAL CARE IF:  The area becomes red, warm, tender, and swollen.  You have a fever. Document Released: 11/10/2002 Document Revised: 02/12/2012 Document Reviewed: 10/09/2008 [facility] Patient Information 2015 [place], [place]. This information is not intended to replace advice given to you by your health care provider. Make sure you discuss any questions you have with your health care provider.

## 2015-06-28 NOTE — Progress Notes (Signed)
   Subjective:    Patient ID: Daniel Lamb, male    DOB: 1951/10/13, 64 y.o.   MRN: [id number]  Chief Complaint  Patient presents with  . Follow-up    1 mo ago problem not getting better  jock itch  . Medication Refill    valtrex, nizoral   Patient Active Problem List   Diagnosis Date Noted  . History of herpes genitalis 06/28/2015   Prior to Admission medications   Medication Sig Start Date End Date Taking? Authorizing Provider  ketoconazole (NIZORAL) 2 % cream Apply 1 application topically 2 (two) times daily. 05/19/15  Yes Robyn Haber, MD  valACYclovir (VALTREX) 500 MG tablet TAKE 1 TABLET BY MOUTH TWICE DAILY FOR 3 DAYS FOR ACUTE OUTBREAK: THEN GO BACK TO 2 TABLETS DAILY FOR SUPPRESSION "OV NEEDED FOR REFILLS" 05/01/15  Yes Chelle Jeffery, PA-C   Medications, allergies, past medical history, surgical history, family history, social history and problem list reviewed and updated.  HPI  31 yom presents with ongoing jock itch and needing med refills.  Seen one month ago diagnosed with tinea cruris. Started on ketoconazole 2% cream. Applying qd. Sx have persisted.   Also wants refill of valtrex. Hx genital herpes.    Review of Systems No fevers, chills, penile dc, testicular pain.     Objective:   Physical Exam  Constitutional: He is oriented to person, place, and time.  BP 140/78 mmHg  Pulse 66  Temp(Src) 97.9 F (36.6 C) (Oral)  Resp 18  Ht 5\' 5"  (1.651 m)  Wt 158 lb 3.2 oz (71.759 kg)  BMI 26.33 kg/m2  SpO2 98%   Neurological: He is alert and oriented to person, place, and time.  Skin:  Slightly raised mildly erythematous rash right groin. No satellite lesions. No crusting. No vesicles.    Results for orders placed or performed in visit on 06/28/15  POCT Skin KOH  Result Value Ref Range   Skin KOH, POC Negative       Assessment & Plan:   Tinea cruris - Plan: POCT Skin KOH, terbinafine (LAMISIL) 250 MG tablet  History of herpes genitalis - Plan:  valACYclovir (VALTREX) 500 MG tablet  Medication monitoring encounter - Plan: Comprehensive metabolic panel --for tinea cruris will try oral lamisil qd 2 wks as sx ongoing despite 4 wks topical therapy, checking cmp today --skin scraping negative but was unable to get great sample as no crusting over area --refilled valtrex for hx genital herpes  Julieta Gutting, PA-C Physician Assistant-Certified [facility]  06/28/2015 3:14 PM

## 2015-09-01 ENCOUNTER — Ambulatory Visit (INDEPENDENT_AMBULATORY_CARE_PROVIDER_SITE_OTHER): Payer: BLUE CROSS/BLUE SHIELD | Admitting: Family Medicine

## 2015-09-01 VITALS — BP 156/84 | HR 60 | Temp 98.2°F | Resp 18 | Ht 66.0 in | Wt 158.0 lb

## 2015-09-01 DIAGNOSIS — R51 Headache: Secondary | ICD-10-CM

## 2015-09-01 DIAGNOSIS — R519 Headache, unspecified: Secondary | ICD-10-CM

## 2015-09-01 DIAGNOSIS — Z8619 Personal history of other infectious and parasitic diseases: Secondary | ICD-10-CM | POA: Diagnosis not present

## 2015-09-01 DIAGNOSIS — B349 Viral infection, unspecified: Secondary | ICD-10-CM

## 2015-09-01 DIAGNOSIS — Z283 Underimmunization status: Secondary | ICD-10-CM

## 2015-09-01 DIAGNOSIS — Z2839 Other underimmunization status: Secondary | ICD-10-CM

## 2015-09-01 MED ORDER — VALACYCLOVIR HCL 500 MG PO TABS: ORAL_TABLET | ORAL | Status: DC

## 2015-09-01 MED ORDER — ZOSTER VACCINE LIVE 19400 UNT/0.65ML ~~LOC~~ SOLR: 0.6500 mL | Freq: Once | SUBCUTANEOUS | Status: DC

## 2015-09-01 NOTE — Patient Instructions (Signed)
Continue with the herpes prophylaxis with valacyclovir 1 daily  Take Aleve 220 mg 2 tablets twice daily for inflammation and pain  Observe closely for any evidence of rash developing on the face  If getting more sinus symptoms with purulent nasal drainage please let me know so we can put you on antibiotics. I do not believe it is necessary at this time.  Return as needed  Recommend getting your shingles vaccination when you're well.

## 2015-09-01 NOTE — Progress Notes (Signed)
Patient ID: Daniel Lamb, male    DOB: 05-22-1951  Age: 64 y.o. MRN: [id number]  Chief Complaint  Patient presents with  . Facial Pain    left side, sinuses.   . Sore Throat    left side   . Medication Refill    valtrex    Subjective:   Patient is here complaining of pain in his left side of his face which began yesterday. He had minimal nasal stuffiness. He actually started out with hurting in his glands underneath his chin. The pain is just anterior to the left ear and around toward the nose. He also needs a refill on his valacyclovir which she takes for HSV prophylaxis. He has never had a shingles vaccine.  Current allergies, medications, problem list, past/family and social histories reviewed.  Objective:  BP 156/84 mmHg  Pulse 60  Temp(Src) 98.2 F (36.8 C) (Oral)  Resp 18  Ht 5\' 6"  (1.676 m)  Wt 158 lb (71.668 kg)  BMI 25.51 kg/m2  SpO2 99%  No major distress. His TMs are normal. No rashes on his face. Tender just anterior to the left ear. Percussion of the sinuses is not seen particular tender. Throat was clear. Neck supple with some tiny submandibular nodes. Chest clear. Heart regular without murmurs.  Assessment & Plan:   Assessment: 1. History of herpes genitalis   2. Facial pain, acute   3. Immunization deficiency   4. Viral syndrome       Plan: Will treat with anti-inflammatory medication. Watch out for developing shingles. See instructions.    Meds ordered this encounter  Medications  . valACYclovir (VALTREX) 500 MG tablet    Sig: TAKE 1 TABLET daily for herpes prophylaxis    Dispense:  90 tablet    Refill:  3  . zoster vaccine live, PF, (ZOSTAVAX) 38882 UNT/0.65ML injection    Sig: Inject 19,400 Units into the skin once.    Dispense:  1 each    Refill:  0         Patient Instructions  Continue with the herpes prophylaxis with valacyclovir 1 daily  Take Aleve 220 mg 2 tablets twice daily for inflammation and pain  Observe closely for  any evidence of rash developing on the face  If getting more sinus symptoms with purulent nasal drainage please let me know so we can put you on antibiotics. I do not believe it is necessary at this time.  Return as needed  Recommend getting your shingles vaccination when you're well.     Return if symptoms worsen or fail to improve.   HOPPER,DAVID, MD 09/01/2015

## 2015-09-05 ENCOUNTER — Ambulatory Visit (INDEPENDENT_AMBULATORY_CARE_PROVIDER_SITE_OTHER): Payer: BLUE CROSS/BLUE SHIELD | Admitting: Physician Assistant

## 2015-09-05 VITALS — BP 152/88 | HR 58 | Temp 98.5°F | Resp 18 | Ht 66.0 in | Wt 159.0 lb

## 2015-09-05 DIAGNOSIS — R591 Generalized enlarged lymph nodes: Secondary | ICD-10-CM | POA: Diagnosis not present

## 2015-09-05 DIAGNOSIS — R030 Elevated blood-pressure reading, without diagnosis of hypertension: Secondary | ICD-10-CM

## 2015-09-05 DIAGNOSIS — IMO0001 Reserved for inherently not codable concepts without codable children: Secondary | ICD-10-CM

## 2015-09-05 DIAGNOSIS — K0889 Other specified disorders of teeth and supporting structures: Secondary | ICD-10-CM

## 2015-09-05 MED ORDER — NAPROXEN 500 MG PO TABS: 500.0000 mg | ORAL_TABLET | Freq: Two times a day (BID) | ORAL | Status: DC

## 2015-09-05 MED ORDER — AMOXICILLIN-POT CLAVULANATE 875-125 MG PO TABS: 1.0000 | ORAL_TABLET | Freq: Two times a day (BID) | ORAL | Status: DC

## 2015-09-05 NOTE — Progress Notes (Signed)
09/05/2015 at 9:22 AM  Daniel Lamb / DOB: 1950/12/26 / MRN: [id number]  The patient has History of herpes genitalis on his problem list.  SUBJECTIVE  Daniel Lamb is a 64 y.o. well appearing male with a history of dental abscess presenting for the chief complaint of 4 days of left sided inferior molar pain that he describes as a dull ache. The pain is moderate to severe.  Associates right sided tonsillar lymph node swelling and pain.  He is taking Aleve qd without relief.  He has a message in at his dentist office to see if he can be seen early next week.  Denies a history of kidney disease, PUD, and GERD.   He reports a history of elevated BP in the office. Ambulatory measures taken often and typically measure at 120-130/80-90.    He  has no past medical history on file.    Medications reviewed and updated by myself where necessary, and exist elsewhere in the encounter.   Daniel Lamb has No Known Allergies. He  reports that he has never smoked. He does not have any smokeless tobacco history on file. He reports that he drinks alcohol. He reports that he does not use illicit drugs. He  has no sexual activity history on file. The patient  has past surgical history that includes Vasectomy.  His family history includes Cancer in his father and mother.  Review of Systems  Constitutional: Negative for fever and chills.  Respiratory: Negative for shortness of breath.   Cardiovascular: Negative for chest pain.  Gastrointestinal: Negative for nausea and abdominal pain.  Genitourinary: Negative.   Skin: Negative for rash.  Neurological: Negative for dizziness and headaches.    OBJECTIVE  His  height is 5\' 6"  (1.676 m) and weight is 159 lb (72.122 kg). His oral temperature is 98.5 F (36.9 C). His blood pressure is 152/88 and his pulse is 58. His respiration is 18 and oxygen saturation is 99%.  The patient's body mass index is 25.68 kg/(m^2).  Physical Exam  Vitals  reviewed. Constitutional: He is oriented to person, place, and time. He appears well-developed. No distress.  HENT:  Mouth/Throat:    Eyes: EOM are normal. Pupils are equal, round, and reactive to light. No scleral icterus.  Neck: Normal range of motion.  Cardiovascular: Normal rate and regular rhythm.   Respiratory: Effort normal and breath sounds normal.  GI: He exhibits no distension.  Musculoskeletal: Normal range of motion.  Lymphadenopathy:       Head (right side): No submental, no submandibular and no tonsillar adenopathy present.       Head (left side): Submandibular adenopathy present. No submental and no tonsillar adenopathy present.    He has no cervical adenopathy.  Neurological: He is alert and oriented to person, place, and time. No cranial nerve deficit.  Skin: Skin is warm and dry. No rash noted. He is not diaphoretic.  Psychiatric: He has a normal mood and affect.    No results found for this or any previous visit (from the past 24 hour(s)).  ASSESSMENT & PLAN  Daniel Lamb was seen today for dental pain and adenopathy.  Diagnoses and all orders for this visit:  Pain in tooth -     amoxicillin-clavulanate (AUGMENTIN) 875-125 MG tablet; Take 1 tablet by mouth 2 (two) times daily. -     naproxen (NAPROSYN) 500 MG tablet; Take 1 tablet (500 mg total) by mouth 2 (two) times daily with a meal.  Lymphadenopathy -  amoxicillin-clavulanate (AUGMENTIN) 875-125 MG tablet; Take 1 tablet by mouth 2 (two) times daily.  Elevated BP: Patient advised to continue ambulatory monitoring. Advised that if his pressure is consistently above 140/90 to RTC.       The patient was advised to call or come back to clinic if he does not see an improvement in symptoms, or worsens with the above plan.   Philis Fendt, MHS, PA-C [facility] 09/05/2015 9:22 AM

## 2016-02-15 ENCOUNTER — Telehealth: Payer: Self-pay | Admitting: Family Medicine

## 2016-02-15 NOTE — Telephone Encounter (Signed)
Left a message for patent to return call about the flu shot.  If they have had it where and when, if not they need to come by and receive it. 

## 2018-06-03 DIAGNOSIS — H11001 Unspecified pterygium of right eye: Secondary | ICD-10-CM | POA: Diagnosis not present

## 2018-06-21 ENCOUNTER — Telehealth: Payer: Self-pay | Admitting: Physician Assistant

## 2018-06-21 NOTE — Telephone Encounter (Signed)
I called patient because his CPE from 2012 is printed out and ready for pick up

## 2018-07-09 ENCOUNTER — Ambulatory Visit (INDEPENDENT_AMBULATORY_CARE_PROVIDER_SITE_OTHER): Payer: Medicare Other | Admitting: Urgent Care

## 2018-07-09 ENCOUNTER — Other Ambulatory Visit: Payer: Self-pay

## 2018-07-09 ENCOUNTER — Encounter: Payer: Self-pay | Admitting: Urgent Care

## 2018-07-09 VITALS — BP 169/87 | HR 68 | Temp 97.9°F | Resp 16 | Ht 66.0 in | Wt 153.2 lb

## 2018-07-09 DIAGNOSIS — N529 Male erectile dysfunction, unspecified: Secondary | ICD-10-CM | POA: Diagnosis not present

## 2018-07-09 DIAGNOSIS — Z8619 Personal history of other infectious and parasitic diseases: Secondary | ICD-10-CM

## 2018-07-09 DIAGNOSIS — Z1159 Encounter for screening for other viral diseases: Secondary | ICD-10-CM

## 2018-07-09 DIAGNOSIS — R030 Elevated blood-pressure reading, without diagnosis of hypertension: Secondary | ICD-10-CM

## 2018-07-09 DIAGNOSIS — Z125 Encounter for screening for malignant neoplasm of prostate: Secondary | ICD-10-CM | POA: Diagnosis not present

## 2018-07-09 DIAGNOSIS — Z23 Encounter for immunization: Secondary | ICD-10-CM | POA: Diagnosis not present

## 2018-07-09 DIAGNOSIS — I10 Essential (primary) hypertension: Secondary | ICD-10-CM | POA: Diagnosis not present

## 2018-07-09 DIAGNOSIS — R739 Hyperglycemia, unspecified: Secondary | ICD-10-CM | POA: Diagnosis not present

## 2018-07-09 DIAGNOSIS — Z0000 Encounter for general adult medical examination without abnormal findings: Secondary | ICD-10-CM

## 2018-07-09 DIAGNOSIS — Z1211 Encounter for screening for malignant neoplasm of colon: Secondary | ICD-10-CM | POA: Diagnosis not present

## 2018-07-09 MED ORDER — SILDENAFIL CITRATE 20 MG PO TABS: 20.0000 mg | ORAL_TABLET | Freq: Every day | ORAL | 5 refills | Status: DC | PRN

## 2018-07-09 MED ORDER — LISINOPRIL 10 MG PO TABS
10.0000 mg | ORAL_TABLET | Freq: Every day | ORAL | 1 refills | Status: DC
Start: 2018-07-09 — End: 2018-08-07

## 2018-07-09 NOTE — Patient Instructions (Addendum)
Health Maintenance, Male A healthy lifestyle and preventive care is important for your health and wellness. Ask your health care provider about what schedule of regular examinations is right for you. What should I know about weight and diet? Eat a Healthy Diet  Eat plenty of vegetables, fruits, whole grains, low-fat dairy products, and lean protein.  Do not eat a lot of foods high in solid fats, added sugars, or salt.  Maintain a Healthy Weight Regular exercise can help you achieve or maintain a healthy weight. You should:  Do at least 150 minutes of exercise each week. The exercise should increase your heart rate and make you sweat (moderate-intensity exercise).  Do strength-training exercises at least twice a week.  Watch Your Levels of Cholesterol and Blood Lipids  Have your blood tested for lipids and cholesterol every 5 years starting at 67 years of age. If you are at high risk for heart disease, you should start having your blood tested when you are 67 years old. You may need to have your cholesterol levels checked more often if: ? Your lipid or cholesterol levels are high. ? You are older than 67 years of age. ? You are at high risk for heart disease.  What should I know about cancer screening? Many types of cancers can be detected early and may often be prevented. Lung Cancer  You should be screened every year for lung cancer if: ? You are a current smoker who has smoked for at least 30 years. ? You are a former smoker who has quit within the past 15 years.  Talk to your health care provider about your screening options, when you should start screening, and how often you should be screened.  Colorectal Cancer  Routine colorectal cancer screening usually begins at 67 years of age and should be repeated every 5-10 years until you are 67 years old. You may need to be screened more often if early forms of precancerous polyps or small growths are found. Your health care provider  may recommend screening at an earlier age if you have risk factors for colon cancer.  Your health care provider may recommend using home test kits to check for hidden blood in the stool.  A small camera at the end of a tube can be used to examine your colon (sigmoidoscopy or colonoscopy). This checks for the earliest forms of colorectal cancer.  Prostate and Testicular Cancer  Depending on your age and overall health, your health care provider may do certain tests to screen for prostate and testicular cancer.  Talk to your health care provider about any symptoms or concerns you have about testicular or prostate cancer.  Skin Cancer  Check your skin from head to toe regularly.  Tell your health care provider about any new moles or changes in moles, especially if: ? There is a change in a mole's size, shape, or color. ? You have a mole that is larger than a pencil eraser.  Always use sunscreen. Apply sunscreen liberally and repeat throughout the day.  Protect yourself by wearing long sleeves, pants, a wide-brimmed hat, and sunglasses when outside.  What should I know about heart disease, diabetes, and high blood pressure?  If you are 18-39 years of age, have your blood pressure checked every 3-5 years. If you are 40 years of age or older, have your blood pressure checked every year. You should have your blood pressure measured twice-once when you are at a hospital or clinic, and once when   you are not at a hospital or clinic. Record the average of the two measurements. To check your blood pressure when you are not at a hospital or clinic, you can use: ? An automated blood pressure machine at a pharmacy. ? A home blood pressure monitor.  Talk to your health care provider about your target blood pressure.  If you are between 45-79 years old, ask your health care provider if you should take aspirin to prevent heart disease.  Have regular diabetes screenings by checking your fasting blood  sugar level. ? If you are at a normal weight and have a low risk for diabetes, have this test once every three years after the age of 45. ? If you are overweight and have a high risk for diabetes, consider being tested at a younger age or more often.  A one-time screening for abdominal aortic aneurysm (AAA) by ultrasound is recommended for men aged 65-75 years who are current or former smokers. What should I know about preventing infection? Hepatitis B If you have a higher risk for hepatitis B, you should be screened for this virus. Talk with your health care provider to find out if you are at risk for hepatitis B infection. Hepatitis C Blood testing is recommended for:  Everyone born from 1945 through 1965.  Anyone with known risk factors for hepatitis C.  Sexually Transmitted Diseases (STDs)  You should be screened each year for STDs including gonorrhea and chlamydia if: ? You are sexually active and are younger than 67 years of age. ? You are older than 67 years of age and your health care provider tells you that you are at risk for this type of infection. ? Your sexual activity has changed since you were last screened and you are at an increased risk for chlamydia or gonorrhea. Ask your health care provider if you are at risk.  Talk with your health care provider about whether you are at high risk of being infected with HIV. Your health care provider may recommend a prescription medicine to help prevent HIV infection.  What else can I do?  Schedule regular health, dental, and eye exams.  Stay current with your vaccines (immunizations).  Do not use any tobacco products, such as cigarettes, chewing tobacco, and e-cigarettes. If you need help quitting, ask your health care provider.  Limit alcohol intake to no more than 2 drinks per day. One drink equals 12 ounces of beer, 5 ounces of wine, or 1 ounces of hard liquor.  Do not use street drugs.  Do not share needles.  Ask your  health care provider for help if you need support or information about quitting drugs.  Tell your health care provider if you often feel depressed.  Tell your health care provider if you have ever been abused or do not feel safe at home. This information is not intended to replace advice given to you by your health care provider. Make sure you discuss any questions you have with your health care provider. Document Released: 05/18/2008 Document Revised: 07/19/2016 Document Reviewed: 08/24/2015 Elsevier Interactive Patient Education  2018 [employer]     Hypertension Hypertension, commonly called high blood pressure, is when the force of blood pumping through the arteries is too strong. The arteries are the blood vessels that carry blood from the heart throughout the body. Hypertension forces the heart to work harder to pump blood and may cause arteries to become narrow or stiff. Having untreated or uncontrolled hypertension can cause   heart attacks, strokes, kidney disease, and other problems. A blood pressure reading consists of a higher number over a lower number. Ideally, your blood pressure should be below 120/80. The first ("top") number is called the systolic pressure. It is a measure of the pressure in your arteries as your heart beats. The second ("bottom") number is called the diastolic pressure. It is a measure of the pressure in your arteries as the heart relaxes. What are the causes? The cause of this condition is not known. What increases the risk? Some risk factors for high blood pressure are under your control. Others are not. Factors you can change  Smoking.  Having type 2 diabetes mellitus, high cholesterol, or both.  Not getting enough exercise or physical activity.  Being overweight.  Having too much fat, sugar, calories, or salt (sodium) in your diet.  Drinking too much alcohol. Factors that are difficult or impossible to change  Having chronic kidney  disease.  Having a family history of high blood pressure.  Age. Risk increases with age.  Race. You may be at higher risk if you are African-American.  Gender. Men are at higher risk than women before age 45. After age 65, women are at higher risk than men.  Having obstructive sleep apnea.  Stress. What are the signs or symptoms? Extremely high blood pressure (hypertensive crisis) may cause:  Headache.  Anxiety.  Shortness of breath.  Nosebleed.  Nausea and vomiting.  Severe chest pain.  Jerky movements you cannot control (seizures).  How is this diagnosed? This condition is diagnosed by measuring your blood pressure while you are seated, with your arm resting on a surface. The cuff of the blood pressure monitor will be placed directly against the skin of your upper arm at the level of your heart. It should be measured at least twice using the same arm. Certain conditions can cause a difference in blood pressure between your right and left arms. Certain factors can cause blood pressure readings to be lower or higher than normal (elevated) for a short period of time:  When your blood pressure is higher when you are in a health care provider's office than when you are at home, this is called white coat hypertension. Most people with this condition do not need medicines.  When your blood pressure is higher at home than when you are in a health care provider's office, this is called masked hypertension. Most people with this condition may need medicines to control blood pressure.  If you have a high blood pressure reading during one visit or you have normal blood pressure with other risk factors:  You may be asked to return on a different day to have your blood pressure checked again.  You may be asked to monitor your blood pressure at home for 1 week or longer.  If you are diagnosed with hypertension, you may have other blood or imaging tests to help your health care provider  understand your overall risk for other conditions. How is this treated? This condition is treated by making healthy lifestyle changes, such as eating healthy foods, exercising more, and reducing your alcohol intake. Your health care provider may prescribe medicine if lifestyle changes are not enough to get your blood pressure under control, and if:  Your systolic blood pressure is above 130.  Your diastolic blood pressure is above 80.  Your personal target blood pressure may vary depending on your medical conditions, your age, and other factors. Follow these instructions at home:   Eating and drinking  Eat a diet that is high in fiber and potassium, and low in sodium, added sugar, and fat. An example eating plan is called the DASH (Dietary Approaches to Stop Hypertension) diet. To eat this way: ? Eat plenty of fresh fruits and vegetables. Try to fill half of your plate at each meal with fruits and vegetables. ? Eat whole grains, such as whole wheat pasta, brown rice, or whole grain bread. Fill about one quarter of your plate with whole grains. ? Eat or drink low-fat dairy products, such as skim milk or low-fat yogurt. ? Avoid fatty cuts of meat, processed or cured meats, and poultry with skin. Fill about one quarter of your plate with lean proteins, such as fish, chicken without skin, beans, eggs, and tofu. ? Avoid premade and processed foods. These tend to be higher in sodium, added sugar, and fat.  Reduce your daily sodium intake. Most people with hypertension should eat less than 1,500 mg of sodium a day.  Limit alcohol intake to no more than 1 drink a day for nonpregnant women and 2 drinks a day for men. One drink equals 12 oz of beer, 5 oz of wine, or 1 oz of hard liquor. Lifestyle  Work with your health care provider to maintain a healthy body weight or to lose weight. Ask what an ideal weight is for you.  Get at least 30 minutes of exercise that causes your heart to beat faster  (aerobic exercise) most days of the week. Activities may include walking, swimming, or biking.  Include exercise to strengthen your muscles (resistance exercise), such as pilates or lifting weights, as part of your weekly exercise routine. Try to do these types of exercises for 30 minutes at least 3 days a week.  Do not use any products that contain nicotine or tobacco, such as cigarettes and e-cigarettes. If you need help quitting, ask your health care provider.  Monitor your blood pressure at home as told by your health care provider.  Keep all follow-up visits as told by your health care provider. This is important. Medicines  Take over-the-counter and prescription medicines only as told by your health care provider. Follow directions carefully. Blood pressure medicines must be taken as prescribed.  Do not skip doses of blood pressure medicine. Doing this puts you at risk for problems and can make the medicine less effective.  Ask your health care provider about side effects or reactions to medicines that you should watch for. Contact a health care provider if:  You think you are having a reaction to a medicine you are taking.  You have headaches that keep coming back (recurring).  You feel dizzy.  You have swelling in your ankles.  You have trouble with your vision. Get help right away if:  You develop a severe headache or confusion.  You have unusual weakness or numbness.  You feel faint.  You have severe pain in your chest or abdomen.  You vomit repeatedly.  You have trouble breathing. Summary  Hypertension is when the force of blood pumping through your arteries is too strong. If this condition is not controlled, it may put you at risk for serious complications.  Your personal target blood pressure may vary depending on your medical conditions, your age, and other factors. For most people, a normal blood pressure is less than 120/80.  Hypertension is treated with  lifestyle changes, medicines, or a combination of both. Lifestyle changes include weight loss, eating a healthy,   low-sodium diet, exercising more, and limiting alcohol. This information is not intended to replace advice given to you by your health care provider. Make sure you discuss any questions you have with your health care provider. Document Released: 11/20/2005 Document Revised: 10/18/2016 Document Reviewed: 10/18/2016 Elsevier Interactive Patient Education  2018 [employer].    Lisinopril tablets What is this medicine? LISINOPRIL (lyse [place] pril) is an ACE inhibitor. This medicine is used to treat high blood pressure and heart failure. It is also used to protect the heart immediately after a heart attack. This medicine may be used for other purposes; ask your health care provider or pharmacist if you have questions. COMMON BRAND NAME(S): Prinivil, Zestril What should I tell my health care provider before I take this medicine? They need to know if you have any of these conditions: -diabetes -heart or blood vessel disease -kidney disease -low blood pressure -previous swelling of the tongue, face, or lips with difficulty breathing, difficulty swallowing, hoarseness, or tightening of the throat -an unusual or allergic reaction to lisinopril, other ACE inhibitors, insect venom, foods, dyes, or preservatives -pregnant or trying to get pregnant -breast-feeding How should I use this medicine? Take this medicine by mouth with a glass of water. Follow the directions on your prescription label. You may take this medicine with or without food. If it upsets your stomach, take it with food. Take your medicine at regular intervals. Do not take it more often than directed. Do not stop taking except on your doctor's advice. Talk to your pediatrician regarding the use of this medicine in children. Special care may be needed. While this drug may be prescribed for children as young as 2 years of age  for selected conditions, precautions do apply. Overdosage: If you think you have taken too much of this medicine contact a poison control center or emergency room at once. NOTE: This medicine is only for you. Do not share this medicine with others. What if I miss a dose? If you miss a dose, take it as soon as you can. If it is almost time for your next dose, take only that dose. Do not take double or extra doses. What may interact with this medicine? Do not take this medicine with any of the following medications: -hymenoptera venom -sacubitril; valsartan This medicines may also interact with the following medications: -aliskiren -angiotensin receptor blockers, like losartan or valsartan -certain medicines for diabetes -diuretics -everolimus -gold compounds -lithium -NSAIDs, medicines for pain and inflammation, like ibuprofen or naproxen -potassium salts or supplements -salt substitutes -sirolimus -temsirolimus This list may not describe all possible interactions. Give your health care provider a list of all the medicines, herbs, non-prescription drugs, or dietary supplements you use. Also tell them if you smoke, drink alcohol, or use illegal drugs. Some items may interact with your medicine. What should I watch for while using this medicine? Visit your doctor or health care professional for regular check ups. Check your blood pressure as directed. Ask your doctor what your blood pressure should be, and when you should contact him or her. Do not treat yourself for coughs, colds, or pain while you are using this medicine without asking your doctor or health care professional for advice. Some ingredients may increase your blood pressure. Women should inform their doctor if they wish to become pregnant or think they might be pregnant. There is a potential for serious side effects to an unborn child. Talk to your health care professional or pharmacist for more information.  Check with your  doctor or health care professional if you get an attack of severe diarrhea, nausea and vomiting, or if you sweat a lot. The loss of too much body fluid can make it dangerous for you to take this medicine. You may get drowsy or dizzy. Do not drive, use machinery, or do anything that needs mental alertness until you know how this drug affects you. Do not stand or sit up quickly, especially if you are an older patient. This reduces the risk of dizzy or fainting spells. Alcohol can make you more drowsy and dizzy. Avoid alcoholic drinks. Avoid salt substitutes unless you are told otherwise by your doctor or health care professional. What side effects may I notice from receiving this medicine? Side effects that you should report to your doctor or health care professional as soon as possible: -allergic reactions like skin rash, itching or hives, swelling of the hands, feet, face, lips, throat, or tongue -breathing problems -signs and symptoms of kidney injury like trouble passing urine or change in the amount of urine -signs and symptoms of increased potassium like muscle weakness; chest pain; or fast, irregular heartbeat -signs and symptoms of liver injury like dark yellow or brown urine; general ill feeling or flu-like symptoms; light-colored stools; loss of appetite; nausea; right upper belly pain; unusually weak or tired; yellowing of the eyes or skin -signs and symptoms of low blood pressure like dizziness; feeling faint or lightheaded, falls; unusually weak or tired -stomach pain with or without nausea and vomiting Side effects that usually do not require medical attention (report to your doctor or health care professional if they continue or are bothersome): -changes in taste -cough -dizziness -fever -headache -sensitivity to light This list may not describe all possible side effects. Call your doctor for medical advice about side effects. You may report side effects to FDA at  1-800-FDA-1088. Where should I keep my medicine? Keep out of the reach of children. Store at room temperature between 15 and 30 degrees C (59 and 86 degrees F). Protect from moisture. Keep container tightly closed. Throw away any unused medicine after the expiration date. NOTE: This sheet is a summary. It may not cover all possible information. If you have questions about this medicine, talk to your doctor, pharmacist, or health care provider.  2018 Elsevier/Gold Standard (2016-01-10 12:52:35)     Erectile Dysfunction Erectile dysfunction (ED) is the inability to get or keep an erection in order to have sexual intercourse. Erectile dysfunction may include:  Inability to get an erection.  Lack of enough hardness of the erection to allow penetration.  Loss of the erection before sex is finished.  What are the causes? This condition may be caused by:  Certain medicines, such as: ? Pain relievers. ? Antihistamines. ? Antidepressants. ? Blood pressure medicines. ? Water pills (diuretics). ? Ulcer medicines. ? Muscle relaxants. ? Drugs.  Excessive drinking.  Psychological causes, such as: ? Anxiety. ? Depression. ? Sadness. ? Exhaustion. ? Performance fear. ? Stress.  Physical causes, such as: ? Artery problems. This may include diabetes, smoking, liver disease, or atherosclerosis. ? High blood pressure. ? Hormonal problems, such as low testosterone. ? Obesity. ? Nerve problems. This may include back or pelvic injuries, diabetes mellitus, multiple sclerosis, or Parkinson disease.  What are the signs or symptoms? Symptoms of this condition include:  Inability to get an erection.  Lack of enough hardness of the erection to allow penetration.  Loss of the erection before sex is finished.  Normal erections at some times, but with frequent unsatisfactory episodes.  Low sexual satisfaction in either partner due to erection problems.  A curved penis occurring with  erection. The curve may cause pain or the penis may be too curved to allow for intercourse.  Never having nighttime erections.  How is this diagnosed? This condition is often diagnosed by:  Performing a physical exam to find other diseases or specific problems with the penis.  Asking you detailed questions about the problem.  Performing blood tests to check for diabetes mellitus or to measure hormone levels.  Performing other tests to check for underlying health conditions.  Performing an ultrasound exam to check for scarring.  Performing a test to check blood flow to the penis.  Doing a sleep study at home to measure nighttime erections.  How is this treated? This condition may be treated by:  Medicine taken by mouth to help you achieve an erection (oral medicine).  Hormone replacement therapy to replace low testosterone levels.  Medicine that is injected into the penis. Your health care provider may instruct you how to give yourself these injections at home.  Vacuum pump. This is a pump with a ring on it. The pump and ring are placed on the penis and used to create pressure that helps the penis become erect.  Penile implant surgery. In this procedure, you may receive: ? An inflatable implant. This consists of cylinders, a pump, and a reservoir. The cylinders can be inflated with a fluid that helps to create an erection, and they can be deflated after intercourse. ? A semi-rigid implant. This consists of two silicone rubber rods. The rods provide some rigidity. They are also flexible, so the penis can both curve downward in its normal position and become straight for sexual intercourse.  Blood vessel surgery, to improve blood flow to the penis. During this procedure, a blood vessel from a different part of the body is placed into the penis to allow blood to flow around (bypass) damaged or blocked blood vessels.  Lifestyle changes, such as exercising more, losing weight, and  quitting smoking.  Follow these instructions at home: Medicines  Take over-the-counter and prescription medicines only as told by your health care provider. Do not increase the dosage without first discussing it with your health care provider.  If you are using self-injections, perform injections as directed by your health care provider. Make sure to avoid any veins that are on the surface of the penis. After giving an injection, apply pressure to the injection site for 5 minutes. General instructions  Exercise regularly, as directed by your health care provider. Work with your health care provider to lose weight, if needed.  Do not use any products that contain nicotine or tobacco, such as cigarettes and e-cigarettes. If you need help quitting, ask your health care provider.  Before using a vacuum pump, read the instructions that come with the pump and discuss any questions with your health care provider.  Keep all follow-up visits as told by your health care provider. This is important. Contact a health care provider if:  You feel nauseous.  You vomit. Get help right away if:  You are taking oral or injectable medicines and you have an erection that lasts longer than 4 hours. If your health care provider is unavailable, go to the nearest emergency room for evaluation. An erection that lasts much longer than 4 hours can result in permanent damage to your penis.  You have severe pain  in your groin or abdomen.  You develop redness or severe swelling of your penis.  You have redness spreading up into your groin or lower abdomen.  You are unable to urinate.  You experience chest pain or a rapid heart beat (palpitations) after taking oral medicines. Summary  Erectile dysfunction (ED) is the inability to get or keep an erection during sexual intercourse. This problem can usually be treated successfully.  This condition is diagnosed based on a physical exam, your symptoms, and tests  to determine the cause. Treatment varies depending on the cause, and may include medicines, hormone therapy, surgery, or vacuum pump.  You may need follow-up visits to make sure that you are using your medicines or devices correctly.  Get help right away if you are taking or injecting medicines and you have an erection that lasts longer than 4 hours. This information is not intended to replace advice given to you by your health care provider. Make sure you discuss any questions you have with your health care provider. Document Released: 11/17/2000 Document Revised: 12/06/2016 Document Reviewed: 12/06/2016 Elsevier Interactive Patient Education  2017 [employer].    Sildenafil tablets (Revatio) What is this medicine? SILDENAFIL (sil DEN a fil) is used to treat pulmonary arterial hypertension. This is a serious heart and lung condition. This medicine helps to improve symptoms and quality of life. This medicine may be used for other purposes; ask your health care provider or pharmacist if you have questions. COMMON BRAND NAME(S): Revatio What should I tell my health care provider before I take this medicine? They need to know if you have any of these conditions: -anatomical deformation of the penis, Peyronie's disease, or history of priapism (painful and prolonged erection) -bleeding disorders -eye disease, vision problems -heart disease -high or low blood pressure -history of blood diseases, like sickle cell anemia or leukemia -kidney disease -liver disease -pulmonary veno-occlusive disease (PVOD) -stomach ulcer -an unusual or allergic reaction to sildenafil, other medicines, foods, dyes, or preservatives -pregnant or trying to get pregnant -breast-feeding How should I use this medicine? Take this medicine by mouth with a glass of water. Follow the directions on the prescription label. You can take it with or without food. If it upsets your stomach, take it with food. Take your doses  at regular intervals about 4 to 6 hours apart. Do not take it more often than directed. Do not stop taking except on your doctor's advice. Talk to your pediatrician regarding the use of this medicine in children. This medicine is not approved for use in children. Overdosage: If you think you have taken too much of this medicine contact a poison control center or emergency room at once. NOTE: This medicine is only for you. Do not share this medicine with others. What if I miss a dose? If you miss a dose, take it as soon as you can. If it is almost time for your next dose, take only that dose. Do not take double or extra doses. What may interact with this medicine? Do not take this medicine with any of the following medications: -cisapride -cobicistat -nitrates like amyl nitrite, isosorbide dinitrate, isosorbide mononitrate, nitroglycerin -riociguat -telaprevir This medicine may also interact with the following medications: -antiviral medicines for HIV or AIDS -bosentan -certain medicines for benign prostatic hyperplasia (BPH) -certain medicines for blood pressure -certain medicines for fungal infections like ketoconazole and itraconazole -cimetidine -erythromycin -rifampin This list may not describe all possible interactions. Give your health care provider a list of  all the medicines, herbs, non-prescription drugs, or dietary supplements you use. Also tell them if you smoke, drink alcohol, or use illegal drugs. Some items may interact with your medicine. What should I watch for while using this medicine? Tell your doctor or healthcare professional if your symptoms do not start to get better or if they get worse. Tell your doctor or health care professional right away if you have any change in your eyesight or hearing. You may get dizzy. Do not drive, use machinery, or do anything that needs mental alertness until you know how this medicine affects you. Do not stand or sit up quickly,  especially if you are an older patient. This reduces the risk of dizzy or fainting spells. Avoid alcoholic drinks; they can make you more dizzy. What side effects may I notice from receiving this medicine? Side effects that you should report to your doctor or health care professional as soon as possible: -allergic reactions like skin rash, itching or hives, swelling of the face, lips, or tongue -breathing problems -changes in vision -chest pain -decreased hearing -fast, irregular heartbeat -men: prolonged or painful erection (lasting more than 4 hours) Side effects that usually do not require medical attention (report to your doctor or health care professional if they continue or are bothersome): -facial flushing -headache -nosebleed -trouble sleeping -upset stomach This list may not describe all possible side effects. Call your doctor for medical advice about side effects. You may report side effects to FDA at 1-800-FDA-1088. Where should I keep my medicine? Keep out of reach of children. Store at room temperature between 15 and 30 degrees C (59 and 86 degrees F). Throw away any unused medicine after the expiration date. NOTE: This sheet is a summary. It may not cover all possible information. If you have questions about this medicine, talk to your doctor, pharmacist, or health care provider.  2018 Elsevier/Gold Standard (2015-11-03 17:18:06)     IF you received an x-ray today, you will receive an invoice from [facility]. Please contact [facility] at [phone] with questions or concerns regarding your invoice.   IF you received labwork today, you will receive an invoice from [facility]. Please contact [facility] at [phone] with questions or concerns regarding your invoice.   Our billing staff will not be able to assist you with questions regarding bills from these companies.  You will be contacted with the lab results as soon as they are available. The  fastest way to get your results is to activate your My Chart account. Instructions are located on the last page of this paperwork. If you have not heard from [place] regarding the results in 2 weeks, please contact this office.

## 2018-07-09 NOTE — Progress Notes (Signed)
MRN: [id number]  Subjective:   [name] is a 67 y.o. male presenting for annual physical exam and office visit for erectile dysfunction.  Patient is married, works as a Engineer, manufacturing.  He has a good support network at home and with work.  Denies smoking cigarettes has about 3 alcoholic drinks per day.  No other drug use.  Medical care team includes: PCP: Robyn Haber, MD Vision: Last eye exam was 06/25/2018, no significant findings. Dental: Cleanings every 6 months. Specialists: None.  Health Maintenance: Patient is due for his Prevnar 20, colonoscopy (has never had one).  He is also due for his Tdap but given that Medicare does not cover this will hold off.  Office Visit - Reports difficulty obtaining and maintaining erections.  Has felt very despondent by this and feels that it is affecting his relationship with his wife.  He has not had a checkup in a long time and is very interested in starting medication for erectile dysfunction.  He has never previously been on this.  He does not see any specialist.  Denies review of systems as below.  Social history as above.  Daniel Lamb is not currently taking any medications.  He has No Known Allergies. Daniel Lamb  has a past medical history of Genital herpes. Also  has a past surgical history that includes Vasectomy. His family history includes Cancer in his father and mother.  Review of Systems  Constitutional: Negative for chills, diaphoresis, fever, malaise/fatigue and weight loss.  HENT: Negative for congestion, ear discharge, ear pain, hearing loss, nosebleeds, sore throat and tinnitus.   Eyes: Negative for blurred vision, double vision, photophobia, pain, discharge and redness.  Respiratory: Negative for cough, shortness of breath and wheezing.   Cardiovascular: Negative for chest pain, palpitations and leg swelling.  Gastrointestinal: Negative for abdominal pain, blood in stool, constipation, diarrhea, nausea and vomiting.   Genitourinary: Negative for dysuria, flank pain, frequency, hematuria and urgency.  Musculoskeletal: Negative for back pain, joint pain and myalgias.  Skin: Negative for itching and rash.  Neurological: Negative for dizziness, tingling, seizures, loss of consciousness, weakness and headaches.  Endo/Heme/Allergies: Negative for polydipsia.  Psychiatric/Behavioral: Negative for depression, hallucinations, memory loss, substance abuse and suicidal ideas. The patient is not nervous/anxious and does not have insomnia.     Objective:   Vitals: BP (!) 169/87   Pulse 68   Temp 97.9 F (36.6 C) (Oral)   Resp 16   Ht 5\' 6"  (1.676 m)   Wt 153 lb 3.2 oz (69.5 kg)   SpO2 99%   BMI 24.73 kg/m   BP Readings from Last 3 Encounters:  07/09/18 (!) 169/87  09/05/15 (!) 152/88  09/01/15 (!) 156/84    Physical Exam  Constitutional: He is oriented to person, place, and time. He appears well-developed and well-nourished.  HENT:  TM's intact bilaterally, no effusions or erythema. Nasal turbinates pink and moist, nasal passages patent. No sinus tenderness. Oropharynx clear, mucous membranes moist, dentition in good repair.  Eyes: Pupils are equal, round, and reactive to light. Conjunctivae and EOM are normal. Right eye exhibits no discharge. Left eye exhibits no discharge. No scleral icterus.  Neck: Normal range of motion. Neck supple. No thyromegaly present.  Cardiovascular: Normal rate, regular rhythm, normal heart sounds and intact distal pulses. Exam reveals no gallop and no friction rub.  No murmur heard. No carotid bruits.    Pulmonary/Chest: No stridor. No respiratory distress. He has no wheezes. He has no rales.  Abdominal: Soft. Bowel sounds are normal. He exhibits no distension and no mass. There is no tenderness. There is no rebound and no guarding.  Musculoskeletal: Normal range of motion. He exhibits no edema or tenderness.  Lymphadenopathy:    He has no cervical adenopathy.    Neurological: He is alert and oriented to person, place, and time. He has normal reflexes. He displays normal reflexes. Coordination normal.  Skin: Skin is warm and dry. No rash noted. No erythema. No pallor.  Psychiatric: He has a normal mood and affect.   Assessment and Plan :   Annual physical exam  Essential hypertension - Plan: Comprehensive metabolic panel, Lipid panel, TSH  Elevated blood pressure reading - Plan: CBC  Encounter for hepatitis C screening test for low risk patient - Plan: Hepatitis C antibody  Screen for colon cancer - Plan: Ambulatory referral to Gastroenterology  Need for prophylactic vaccination against Streptococcus pneumoniae (pneumococcus)  Need for Tdap vaccination  Erectile dysfunction, unspecified erectile dysfunction type  History of herpes genitalis  Screening PSA (prostate specific antigen)  CPE - Labs pending, patient is very pleasant and medically stable. Discussed healthy lifestyle, diet, exercise, preventative care, vaccinations, and addressed patient's concerns.  He was found to have hypertension and is agreeable to start medical therapy for this.  We also discussed lifestyle modifications to help with blood pressure.  He is to maintain healthy diet and exercise.  Will start lisinopril at 10 mg and follow-up in 4 weeks.   OV - Discussed sources for erectile dysfunction and will address this through lifestyle modifications.  We will also start sildenafil to help in the interim.  Counseled patient on potential for adverse effects with medications prescribed today, patient verbalized understanding. Follow up in 4 weeks.  Jaynee Eagles, PA-C [facility] [phone] 07/09/2018  9:02 AM

## 2018-07-10 LAB — COMPREHENSIVE METABOLIC PANEL
ALBUMIN: 4.7 g/dL (ref 3.6–4.8)
ALK PHOS: 46 IU/L (ref 39–117)
ALT: 20 IU/L (ref 0–44)
AST: 30 IU/L (ref 0–40)
Albumin/Globulin Ratio: 1.6 (ref 1.2–2.2)
BILIRUBIN TOTAL: 0.7 mg/dL (ref 0.0–1.2)
BUN / CREAT RATIO: 13 (ref 10–24)
BUN: 13 mg/dL (ref 8–27)
CHLORIDE: 101 mmol/L (ref 96–106)
CO2: 24 mmol/L (ref 20–29)
Calcium: 9.8 mg/dL (ref 8.6–10.2)
Creatinine, Ser: 0.98 mg/dL (ref 0.76–1.27)
GFR calc Af Amer: 92 mL/min/{1.73_m2} (ref >59)
GFR calc non Af Amer: 80 mL/min/{1.73_m2} (ref >59)
Globulin, Total: 2.9 g/dL (ref 1.5–4.5)
Glucose: 135 mg/dL — ABNORMAL HIGH (ref 65–99)
POTASSIUM: 4.4 mmol/L (ref 3.5–5.2)
SODIUM: 141 mmol/L (ref 134–144)
Total Protein: 7.6 g/dL (ref 6.0–8.5)

## 2018-07-10 LAB — LIPID PANEL
CHOLESTEROL TOTAL: 223 mg/dL — AB (ref 100–199)
Chol/HDL Ratio: 2 ratio (ref 0.0–5.0)
HDL: 111 mg/dL (ref >39)
LDL Calculated: 99 mg/dL (ref 0–99)
Triglycerides: 64 mg/dL (ref 0–149)
VLDL CHOLESTEROL CAL: 13 mg/dL (ref 5–40)

## 2018-07-10 LAB — CBC
HEMATOCRIT: 45 % (ref 37.5–51.0)
Hemoglobin: 15.1 g/dL (ref 13.0–17.7)
MCH: 33.9 pg — AB (ref 26.6–33.0)
MCHC: 33.6 g/dL (ref 31.5–35.7)
MCV: 101 fL — AB (ref 79–97)
Platelets: 205 10*3/uL (ref 150–450)
RBC: 4.46 x10E6/uL (ref 4.14–5.80)
RDW: 14.6 % (ref 12.3–15.4)
WBC: 5.6 10*3/uL (ref 3.4–10.8)

## 2018-07-10 LAB — TSH: TSH: 2.42 u[IU]/mL (ref 0.450–4.500)

## 2018-07-10 LAB — HEPATITIS C ANTIBODY

## 2018-07-12 ENCOUNTER — Telehealth: Payer: Self-pay | Admitting: Urgent Care

## 2018-07-12 NOTE — Telephone Encounter (Signed)
Called pt to let them know we would need to reschedule their appt 08/06/18. Rescheduled

## 2018-07-13 DIAGNOSIS — R739 Hyperglycemia, unspecified: Secondary | ICD-10-CM | POA: Diagnosis not present

## 2018-07-13 NOTE — Addendum Note (Signed)
Addended by: Gari Crown D on: 07/13/2018 03:07 PM   Modules accepted: Orders

## 2018-07-14 LAB — HEMOGLOBIN A1C
ESTIMATED AVERAGE GLUCOSE: 108 mg/dL
HEMOGLOBIN A1C: 5.4 % (ref 4.8–5.6)

## 2018-08-06 ENCOUNTER — Ambulatory Visit: Payer: Medicare Other | Admitting: Urgent Care

## 2018-08-07 ENCOUNTER — Encounter: Payer: Self-pay | Admitting: Family Medicine

## 2018-08-07 ENCOUNTER — Encounter: Payer: Self-pay | Admitting: Urgent Care

## 2018-08-07 ENCOUNTER — Other Ambulatory Visit: Payer: Self-pay

## 2018-08-07 ENCOUNTER — Ambulatory Visit (INDEPENDENT_AMBULATORY_CARE_PROVIDER_SITE_OTHER): Payer: Medicare Other | Admitting: Urgent Care

## 2018-08-07 ENCOUNTER — Ambulatory Visit (INDEPENDENT_AMBULATORY_CARE_PROVIDER_SITE_OTHER): Payer: Medicare Other | Admitting: Family Medicine

## 2018-08-07 VITALS — BP 136/82 | HR 52 | Temp 98.1°F | Resp 16 | Ht 65.55 in | Wt 153.0 lb

## 2018-08-07 VITALS — BP 128/82 | Ht 66.0 in | Wt 153.0 lb

## 2018-08-07 DIAGNOSIS — M25561 Pain in right knee: Secondary | ICD-10-CM | POA: Diagnosis not present

## 2018-08-07 DIAGNOSIS — N529 Male erectile dysfunction, unspecified: Secondary | ICD-10-CM

## 2018-08-07 DIAGNOSIS — I10 Essential (primary) hypertension: Secondary | ICD-10-CM

## 2018-08-07 MED ORDER — SILDENAFIL CITRATE 20 MG PO TABS: 20.0000 mg | ORAL_TABLET | Freq: Every day | ORAL | 11 refills | Status: DC | PRN

## 2018-08-07 MED ORDER — LISINOPRIL 10 MG PO TABS: 10.0000 mg | ORAL_TABLET | Freq: Every day | ORAL | 3 refills | Status: DC

## 2018-08-07 NOTE — Progress Notes (Signed)
PCP: Jaynee Eagles, PA-C  Subjective:   HPI: Patient is a 67 y.o. male here for right knee pain.  Onset was 1 month ago. Pain is 7-8/10, sharp, located along medial joint space with radiation to medial patellar tendon.  Patient denies any history of trauma/ injury. He has stopped running in the past few weeks due to pain (usually runs about 15 mi/wk).  Patient mostly experiences pain with runs. He also reports a "nagging ache" 3/10 with walking at same location as more severe pain. Similar pain occurs when he stands after prolonged sitting.   He denies any knee locking, buckling, swelling, weakness, or change in sensation.  Pain improves with icing. Hx of similar knee pain about 15 years ago that resolved with time.   No skin changes, numbness.  Past Medical History:  Diagnosis Date  . Genital herpes   . Hypertension     Current Outpatient Medications on File Prior to Visit  Medication Sig Dispense Refill  . lisinopril (PRINIVIL,ZESTRIL) 10 MG tablet Take 1 tablet (10 mg total) by mouth daily. 90 tablet 3  . sildenafil (REVATIO) 20 MG tablet Take 1-2 tablets (20-40 mg total) by mouth daily as needed. Take 1 hour prior to sexual activity for maximal effectiveness. 30 tablet 11   No current facility-administered medications on file prior to visit.     Past Surgical History:  Procedure Laterality Date  . VASECTOMY      No Known Allergies  Social History   Socioeconomic History  . Marital status: Married    Spouse name: Not on file  . Number of children: Not on file  . Years of education: Not on file  . Highest education level: Not on file  Occupational History  . Not on file  Social Needs  . Financial resource strain: Not on file  . Food insecurity:    Worry: Not on file    Inability: Not on file  . Transportation needs:    Medical: Not on file    Non-medical: Not on file  Tobacco Use  . Smoking status: Never Smoker  . Smokeless tobacco: Never Used  Substance and Sexual  Activity  . Alcohol use: Yes  . Drug use: No  . Sexual activity: Not on file  Lifestyle  . Physical activity:    Days per week: Not on file    Minutes per session: Not on file  . Stress: Not on file  Relationships  . Social connections:    Talks on phone: Not on file    Gets together: Not on file    Attends religious service: Not on file    Active member of club or organization: Not on file    Attends meetings of clubs or organizations: Not on file    Relationship status: Not on file  . Intimate partner violence:    Fear of current or ex partner: Not on file    Emotionally abused: Not on file    Physically abused: Not on file    Forced sexual activity: Not on file  Other Topics Concern  . Not on file  Social History Narrative  . Not on file    Family History  Problem Relation Age of Onset  . Cancer Mother   . Cancer Father     BP 128/82   Ht 5\' 6"  (1.676 m)   Wt 153 lb (69.4 kg)   BMI 24.69 kg/m   Review of Systems: See HPI above.     Objective:  Physical Exam:  Gen: NAD, comfortable in exam room  PULM: no conversational dyspnea, no respiratory distress MUSK: RIGHT KNEE: + crepitus, slight effusion, +tenderness along lateral joint space. FROM. 5/5 strength w/ flexion and extension. Negative Apley's. Positive Mcmurray. Pain with Thessalys.  Negative Lachman's and Lever.  NVI distally.  Left knee: No deformity. FROM with 5/5 strength. No tenderness to palpation. NVI distally.   Assessment & Plan:  1. Right Knee pain DDX:  Degenerative meniscus tear vs arthritis Due to physical exam and history, most likely degenerative lateral meniscus tear.  We discussed using Aleve BID for 7 days then PRN. Continue with icing. We discussed using compression sleeve to help support the knee.  At home Quad strengthening was recommended. Exercises were given to patient.  Prognosis of meniscal tear was fully discussed with patient and patient has agreed with conservative  management. Follow up in 1 month.  Consider physical therapy, injection if not improving.

## 2018-08-07 NOTE — Progress Notes (Signed)
    MRN: [id number] DOB: February 14, 1951  Subjective:   Daniel Lamb is a 67 y.o. male presenting for follow up on high blood pressure and erectile dysfunction.  Patient was started on lisinopril at his last office visit and is doing very well with this.  Reports that his blood pressure checks are between 110 and 120s.  He has been using 1 to 2 pills of sildenafil 1-2 times per week and is doing well with this.  He is agreeable to maintaining both medications.  Denies any side effects.  Daniel Lamb has a current medication list which includes the following prescription(s): lisinopril and sildenafil. Also has No Known Allergies.  Daniel Lamb  has a past medical history of Genital herpes. Also  has a past surgical history that includes Vasectomy.  Objective:   Vitals: BP 136/82   Pulse (!) 52   Temp 98.1 F (36.7 C) (Oral)   Resp 16   Ht 5' 5.55" (1.665 m)   Wt 153 lb (69.4 kg)   SpO2 99%   BMI 25.03 kg/m   Physical Exam  Constitutional: He is oriented to person, place, and time. He appears well-developed and well-nourished.  Cardiovascular:  Mild bradycardia.  Pulmonary/Chest: Effort normal.  Neurological: He is alert and oriented to person, place, and time.   Assessment and Plan :   Essential hypertension  Erectile dysfunction, unspecified erectile dysfunction type  Patient is doing very well and we will maintain his current medications.  I did provide patient with enough refills to last him a year.  He can follow-up then.  Okay to refill his medications prior to a year.  Jaynee Eagles, PA-C [facility] [phone] 08/07/2018 9:51 AM

## 2018-08-07 NOTE — Patient Instructions (Addendum)
I will be working at [facility] in [place], [place]. 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     If you have lab work done today you will be contacted with your lab results within the next 2 weeks.  If you have not heard from [place] then please contact us. The fastest way to get your results is to register for My Chart.     Hypertension Hypertension, commonly called high blood pressure, is when the force of blood pumping through the arteries is too strong. The arteries are the blood vessels that carry blood from the heart throughout the body. Hypertension forces the heart to work harder to pump blood and may cause arteries to become narrow or stiff. Having untreated or uncontrolled hypertension can cause heart attacks, strokes, kidney disease, and other problems. A blood pressure reading consists of a higher number over a lower number. Ideally, your blood pressure should be below 120/80. The first ("top") number is called the systolic pressure. It is a measure of the pressure in your arteries as your heart beats. The second ("bottom") number is called the diastolic pressure. It is a measure of the pressure in your arteries as the heart relaxes. What are the causes? The cause of this condition is not known. What increases the risk? Some risk factors for high blood pressure are under your control. Others are not. Factors you can change  Smoking.  Having type 2 diabetes mellitus, high cholesterol, or both.  Not getting enough exercise or physical activity.  Being overweight.  Having too much fat, sugar, calories, or salt (sodium) in your diet.  Drinking too much alcohol. Factors that are difficult or impossible to change  Having chronic kidney disease.  Having a family history of high blood pressure.  Age. Risk increases with age.  Race. You may be at higher risk if you are African-American.  Gender. Men are at higher risk than women before age 61. After age 58, women are at higher risk  than men.  Having obstructive sleep apnea.  Stress. What are the signs or symptoms? Extremely high blood pressure (hypertensive crisis) may cause:  Headache.  Anxiety.  Shortness of breath.  Nosebleed.  Nausea and vomiting.  Severe chest pain.  Jerky movements you cannot control (seizures).  How is this diagnosed? This condition is diagnosed by measuring your blood pressure while you are seated, with your arm resting on a surface. The cuff of the blood pressure monitor will be placed directly against the skin of your upper arm at the level of your heart. It should be measured at least twice using the same arm. Certain conditions can cause a difference in blood pressure between your right and left arms. Certain factors can cause blood pressure readings to be lower or higher than normal (elevated) for a short period of time:  When your blood pressure is higher when you are in a health care provider's office than when you are at home, this is called white coat hypertension. Most people with this condition do not need medicines.  When your blood pressure is higher at home than when you are in a health care provider's office, this is called masked hypertension. Most people with this condition may need medicines to control blood pressure.  If you have a high blood pressure reading during one visit or you have normal blood pressure with other risk factors:  You may be asked to return on a different day to have your blood pressure checked again.  You may be asked to monitor your blood pressure at home for 1 week or longer.  If you are diagnosed with hypertension, you may have other blood or imaging tests to help your health care provider understand your overall risk for other conditions. How is this treated? This condition is treated by making healthy lifestyle changes, such as eating healthy foods, exercising more, and reducing your alcohol intake. Your health care provider may  prescribe medicine if lifestyle changes are not enough to get your blood pressure under control, and if:  Your systolic blood pressure is above 130.  Your diastolic blood pressure is above 80.  Your personal target blood pressure may vary depending on your medical conditions, your age, and other factors. Follow these instructions at home: Eating and drinking  Eat a diet that is high in fiber and potassium, and low in sodium, added sugar, and fat. An example eating plan is called the DASH (Dietary Approaches to Stop Hypertension) diet. To eat this way: ? Eat plenty of fresh fruits and vegetables. Try to fill half of your plate at each meal with fruits and vegetables. ? Eat whole grains, such as whole wheat pasta, brown rice, or whole grain bread. Fill about one quarter of your plate with whole grains. ? Eat or drink low-fat dairy products, such as skim milk or low-fat yogurt. ? Avoid fatty cuts of meat, processed or cured meats, and poultry with skin. Fill about one quarter of your plate with lean proteins, such as fish, chicken without skin, beans, eggs, and tofu. ? Avoid premade and processed foods. These tend to be higher in sodium, added sugar, and fat.  Reduce your daily sodium intake. Most people with hypertension should eat less than 1,500 mg of sodium a day.  Limit alcohol intake to no more than 1 drink a day for nonpregnant women and 2 drinks a day for men. One drink equals 12 oz of beer, 5 oz of wine, or 1 oz of hard liquor. Lifestyle  Work with your health care provider to maintain a healthy body weight or to lose weight. Ask what an ideal weight is for you.  Get at least 30 minutes of exercise that causes your heart to beat faster (aerobic exercise) most days of the week. Activities may include walking, swimming, or biking.  Include exercise to strengthen your muscles (resistance exercise), such as pilates or lifting weights, as part of your weekly exercise routine. Try to do  these types of exercises for 30 minutes at least 3 days a week.  Do not use any products that contain nicotine or tobacco, such as cigarettes and e-cigarettes. If you need help quitting, ask your health care provider.  Monitor your blood pressure at home as told by your health care provider.  Keep all follow-up visits as told by your health care provider. This is important. Medicines  Take over-the-counter and prescription medicines only as told by your health care provider. Follow directions carefully. Blood pressure medicines must be taken as prescribed.  Do not skip doses of blood pressure medicine. Doing this puts you at risk for problems and can make the medicine less effective.  Ask your health care provider about side effects or reactions to medicines that you should watch for. Contact a health care provider if:  You think you are having a reaction to a medicine you are taking.  You have headaches that keep coming back (recurring).  You feel dizzy.  You have swelling in your ankles.  You have trouble with your vision. Get help right away if:  You develop a severe headache or confusion.  You have unusual weakness or numbness.  You feel faint.  You have severe pain in your chest or abdomen.  You vomit repeatedly.  You have trouble breathing. Summary  Hypertension is when the force of blood pumping through your arteries is too strong. If this condition is not controlled, it may put you at risk for serious complications.  Your personal target blood pressure may vary depending on your medical conditions, your age, and other factors. For most people, a normal blood pressure is less than 120/80.  Hypertension is treated with lifestyle changes, medicines, or a combination of both. Lifestyle changes include weight loss, eating a healthy, low-sodium diet, exercising more, and limiting alcohol. This information is not intended to replace advice given to you by your health  care provider. Make sure you discuss any questions you have with your health care provider. Document Released: 11/20/2005 Document Revised: 10/18/2016 Document Reviewed: 10/18/2016 Elsevier Interactive Patient Education  2018 [employer].    Sildenafil tablets (Revatio) What is this medicine? SILDENAFIL (sil DEN a fil) is used to treat pulmonary arterial hypertension. This is a serious heart and lung condition. This medicine helps to improve symptoms and quality of life. This medicine may be used for other purposes; ask your health care provider or pharmacist if you have questions. COMMON BRAND NAME(S): Revatio What should I tell my health care provider before I take this medicine? They need to know if you have any of these conditions: -anatomical deformation of the penis, Peyronie's disease, or history of priapism (painful and prolonged erection) -bleeding disorders -eye disease, vision problems -heart disease -high or low blood pressure -history of blood diseases, like sickle cell anemia or leukemia -kidney disease -liver disease -pulmonary veno-occlusive disease (PVOD) -stomach ulcer -an unusual or allergic reaction to sildenafil, other medicines, foods, dyes, or preservatives -pregnant or trying to get pregnant -breast-feeding How should I use this medicine? Take this medicine by mouth with a glass of water. Follow the directions on the prescription label. You can take it with or without food. If it upsets your stomach, take it with food. Take your doses at regular intervals about 4 to 6 hours apart. Do not take it more often than directed. Do not stop taking except on your doctor's advice. Talk to your pediatrician regarding the use of this medicine in children. This medicine is not approved for use in children. Overdosage: If you think you have taken too much of this medicine contact a poison control center or emergency room at once. NOTE: This medicine is only for you. Do not  share this medicine with others. What if I miss a dose? If you miss a dose, take it as soon as you can. If it is almost time for your next dose, take only that dose. Do not take double or extra doses. What may interact with this medicine? Do not take this medicine with any of the following medications: -cisapride -cobicistat -nitrates like amyl nitrite, isosorbide dinitrate, isosorbide mononitrate, nitroglycerin -riociguat -telaprevir This medicine may also interact with the following medications: -antiviral medicines for HIV or AIDS -bosentan -certain medicines for benign prostatic hyperplasia (BPH) -certain medicines for blood pressure -certain medicines for fungal infections like ketoconazole and itraconazole -cimetidine -erythromycin -rifampin This list may not describe all possible interactions. Give your health care provider a list of all the medicines, herbs, non-prescription drugs, or dietary supplements you  use. Also tell them if you smoke, drink alcohol, or use illegal drugs. Some items may interact with your medicine. What should I watch for while using this medicine? Tell your doctor or healthcare professional if your symptoms do not start to get better or if they get worse. Tell your doctor or health care professional right away if you have any change in your eyesight or hearing. You may get dizzy. Do not drive, use machinery, or do anything that needs mental alertness until you know how this medicine affects you. Do not stand or sit up quickly, especially if you are an older patient. This reduces the risk of dizzy or fainting spells. Avoid alcoholic drinks; they can make you more dizzy. What side effects may I notice from receiving this medicine? Side effects that you should report to your doctor or health care professional as soon as possible: -allergic reactions like skin rash, itching or hives, swelling of the face, lips, or tongue -breathing problems -changes in  vision -chest pain -decreased hearing -fast, irregular heartbeat -men: prolonged or painful erection (lasting more than 4 hours) Side effects that usually do not require medical attention (report to your doctor or health care professional if they continue or are bothersome): -facial flushing -headache -nosebleed -trouble sleeping -upset stomach This list may not describe all possible side effects. Call your doctor for medical advice about side effects. You may report side effects to FDA at 1-800-FDA-1088. Where should I keep my medicine? Keep out of reach of children. Store at room temperature between 15 and 30 degrees C (59 and 86 degrees F). Throw away any unused medicine after the expiration date. NOTE: This sheet is a summary. It may not cover all possible information. If you have questions about this medicine, talk to your doctor, pharmacist, or health care provider.  2018 Elsevier/Gold Standard (2015-11-03 17:18:06)      IF you received an x-ray today, you will receive an invoice from [facility]. Please contact [facility] at [phone] with questions or concerns regarding your invoice.   IF you received labwork today, you will receive an invoice from [facility]. Please contact [facility] at [phone] with questions or concerns regarding your invoice.   Our billing staff will not be able to assist you with questions regarding bills from these companies.  You will be contacted with the lab results as soon as they are available. The fastest way to get your results is to activate your My Chart account. Instructions are located on the last page of this paperwork. If you have not heard from [place] regarding the results in 2 weeks, please contact this office.

## 2018-08-07 NOTE — Patient Instructions (Signed)
Your pain is due to a degenerative meniscus tear vs arthritis. These are treated similarly. Tylenol 500mg  1-2 tabs three times a day for pain. Aleve 1-2 tabs twice a day with food for pain and inflammation. Capsaicin, aspercreme, or biofreeze topically up to four times a day may also help with pain. Some supplements that may help for arthritis: Boswellia extract, curcumin, pycnogenol Cortisone injections are an option if pain is severe. If cortisone injections do not help, there are different types of shots that may help but they take longer to take effect. It's important that you continue to stay active. Straight leg raises, knee extensions, hamstring curls 3 sets of 10 once a day (add ankle weight if these become too easy). Consider physical therapy to strengthen muscles around the joint that hurts to take pressure off of the joint itself. Shoe inserts with good arch support may be helpful. Ice 15 minutes at a time 3-4 times a day as needed to help with pain. Water aerobics and cycling with low resistance are the best two types of exercise for arthritis though any exercise is ok as long as it doesn't worsen the pain. Follow up with me in 1 month.

## 2018-08-30 ENCOUNTER — Ambulatory Visit: Payer: Medicare Other | Admitting: Family Medicine

## 2018-09-02 ENCOUNTER — Encounter: Payer: Self-pay | Admitting: Family Medicine

## 2018-09-02 ENCOUNTER — Ambulatory Visit (INDEPENDENT_AMBULATORY_CARE_PROVIDER_SITE_OTHER): Payer: Medicare Other | Admitting: Family Medicine

## 2018-09-02 VITALS — BP 145/88 | HR 59 | Ht 66.0 in | Wt 153.0 lb

## 2018-09-02 DIAGNOSIS — M25561 Pain in right knee: Secondary | ICD-10-CM | POA: Diagnosis not present

## 2018-09-02 MED ORDER — METHYLPREDNISOLONE ACETATE 40 MG/ML IJ SUSP
40.0000 mg | Freq: Once | INTRAMUSCULAR | Status: AC
  Administered 2018-09-02: 40 mg via INTRA_ARTICULAR

## 2018-09-02 NOTE — Progress Notes (Signed)
PCP: Jaynee Eagles, PA-C  Subjective:   HPI: Patient is a 67 y.o. male here for right knee pain.  9/4: Onset was 1 month ago. Pain is 7-8/10, sharp, located along medial joint space with radiation to medial patellar tendon.  Patient denies any history of trauma/ injury. He has stopped running in the past few weeks due to pain (usually runs about 15 mi/wk).  Patient mostly experiences pain with runs. He also reports a "nagging ache" 3/10 with walking at same location as more severe pain. Similar pain occurs when he stands after prolonged sitting.   He denies any knee locking, buckling, swelling, weakness, or change in sensation.  Pain improves with icing. Hx of similar knee pain about 15 years ago that resolved with time.   No skin changes, numbness.  9/30: Patient here for follow-up for right knee pain.  He reports overall no significant change since prior visit.  He has been doing home exercises and taking anti-inflammatories.  He returns today and would like a steroid injection prior to an upcoming trip to [place] next week.  Reports 0/10 pain at rest and 3/10 pain with running or when on his feet for extended period of time.  No new swelling or erythema.  No skin changes.  No numbness or tingling  Past Medical History:  Diagnosis Date  . Genital herpes   . Hypertension     Current Outpatient Medications on File Prior to Visit  Medication Sig Dispense Refill  . lisinopril (PRINIVIL,ZESTRIL) 10 MG tablet Take 1 tablet (10 mg total) by mouth daily. 90 tablet 3  . sildenafil (REVATIO) 20 MG tablet Take 1-2 tablets (20-40 mg total) by mouth daily as needed. Take 1 hour prior to sexual activity for maximal effectiveness. 30 tablet 11   No current facility-administered medications on file prior to visit.     Past Surgical History:  Procedure Laterality Date  . VASECTOMY      No Known Allergies  Social History   Socioeconomic History  . Marital status: Married    Spouse name: Not on file   . Number of children: Not on file  . Years of education: Not on file  . Highest education level: Not on file  Occupational History  . Not on file  Social Needs  . Financial resource strain: Not on file  . Food insecurity:    Worry: Not on file    Inability: Not on file  . Transportation needs:    Medical: Not on file    Non-medical: Not on file  Tobacco Use  . Smoking status: Never Smoker  . Smokeless tobacco: Never Used  Substance and Sexual Activity  . Alcohol use: Yes  . Drug use: No  . Sexual activity: Not on file  Lifestyle  . Physical activity:    Days per week: Not on file    Minutes per session: Not on file  . Stress: Not on file  Relationships  . Social connections:    Talks on phone: Not on file    Gets together: Not on file    Attends religious service: Not on file    Active member of club or organization: Not on file    Attends meetings of clubs or organizations: Not on file    Relationship status: Not on file  . Intimate partner violence:    Fear of current or ex partner: Not on file    Emotionally abused: Not on file    Physically abused: Not on  file    Forced sexual activity: Not on file  Other Topics Concern  . Not on file  Social History Narrative  . Not on file    Family History  Problem Relation Age of Onset  . Cancer Mother   . Cancer Father     BP (!) 145/88   Pulse (!) 59   Ht 5\' 6"  (1.676 m)   Wt 153 lb (69.4 kg)   BMI 24.69 kg/m   Review of Systems: See HPI above.     Objective:  Physical Exam:  GEN: No acute distress, awake, alert Pulmonary: Breathing unlabored  Right Knee: - Inspection: no gross deformity. No swelling/effusion, erythema or bruising. Skin intact - Palpation: mild lateral joint TTP - ROM: full active ROM with flexion and extension in knee. Discomfort at full knee flexion - Strength: 5/5 strength - Neuro/vasc: NV intact - Special Tests: - LIGAMENTS: negative anterior and posterior drawer, no MCL or LCL  laxity  -- MENISCUS: pain with thessalys  -- PF JOINT: nml patellar mobility bilaterally.    Assessment & Plan:  1. Right Knee pain -secondary to arthritis versus degenerative meniscal tear. - Continue home exercise - Continue Tylenol and Aleve - Steroid injection performed today as described below - Follow-up as needed  Procedure performed: knee intraarticular corticosteroid injection; palpation guided  Consent obtained and verified. Time-out conducted. Noted no overlying erythema, induration, or other signs of local infection. The right lateral joint space was palpated and marked. The overlying skin was prepped in a sterile fashion. Topical analgesic spray: Ethyl chloride. Joint: Right knee Needle: 25ga, 1" Completed without difficulty. Meds: Depo-Medrol 40 mg, bupivacaine 3 cc   Advised to call if fevers/chills, erythema, induration, drainage, or persistent bleeding.

## 2018-09-10 ENCOUNTER — Encounter: Payer: Self-pay | Admitting: Urgent Care

## 2018-09-18 ENCOUNTER — Ambulatory Visit: Payer: Medicare Other | Admitting: Family Medicine

## 2018-09-25 DIAGNOSIS — D225 Melanocytic nevi of trunk: Secondary | ICD-10-CM | POA: Diagnosis not present

## 2018-09-25 DIAGNOSIS — L573 Poikiloderma of Civatte: Secondary | ICD-10-CM | POA: Diagnosis not present

## 2018-09-25 DIAGNOSIS — L821 Other seborrheic keratosis: Secondary | ICD-10-CM | POA: Diagnosis not present

## 2018-09-25 DIAGNOSIS — D1801 Hemangioma of skin and subcutaneous tissue: Secondary | ICD-10-CM | POA: Diagnosis not present

## 2018-09-25 DIAGNOSIS — D2372 Other benign neoplasm of skin of left lower limb, including hip: Secondary | ICD-10-CM | POA: Diagnosis not present

## 2018-09-25 DIAGNOSIS — D2262 Melanocytic nevi of left upper limb, including shoulder: Secondary | ICD-10-CM | POA: Diagnosis not present

## 2018-10-11 DIAGNOSIS — Z23 Encounter for immunization: Secondary | ICD-10-CM | POA: Diagnosis not present

## 2019-01-22 ENCOUNTER — Encounter: Payer: Self-pay | Admitting: Emergency Medicine

## 2019-01-22 ENCOUNTER — Other Ambulatory Visit: Payer: Self-pay

## 2019-01-22 ENCOUNTER — Ambulatory Visit (INDEPENDENT_AMBULATORY_CARE_PROVIDER_SITE_OTHER): Payer: Medicare Other | Admitting: Emergency Medicine

## 2019-01-22 VITALS — BP 150/86 | HR 59 | Temp 98.7°F | Resp 16 | Ht 65.0 in | Wt 156.2 lb

## 2019-01-22 DIAGNOSIS — Z1211 Encounter for screening for malignant neoplasm of colon: Secondary | ICD-10-CM

## 2019-01-22 DIAGNOSIS — Z8619 Personal history of other infectious and parasitic diseases: Secondary | ICD-10-CM

## 2019-01-22 DIAGNOSIS — B356 Tinea cruris: Secondary | ICD-10-CM

## 2019-01-22 MED ORDER — VALACYCLOVIR HCL 1 G PO TABS: 1000.0000 mg | ORAL_TABLET | Freq: Two times a day (BID) | ORAL | 0 refills | Status: DC

## 2019-01-22 MED ORDER — TERBINAFINE HCL 250 MG PO TABS
250.0000 mg | ORAL_TABLET | Freq: Every day | ORAL | 0 refills | Status: DC
Start: 2019-01-22 — End: 2019-01-22

## 2019-01-22 MED ORDER — CLOTRIMAZOLE-BETAMETHASONE 1-0.05 % EX CREA: 1.0000 "application " | TOPICAL_CREAM | Freq: Two times a day (BID) | CUTANEOUS | 2 refills | Status: DC

## 2019-01-22 MED ORDER — TERBINAFINE HCL 250 MG PO TABS: 250.0000 mg | ORAL_TABLET | Freq: Every day | ORAL | 0 refills | Status: AC

## 2019-01-22 NOTE — Patient Instructions (Addendum)
     If you have lab work done today you will be contacted with your lab results within the next 2 weeks.  If you have not heard from [place] then please contact us. The fastest way to get your results is to register for My Chart.   IF you received an x-ray today, you will receive an invoice from [facility]. Please contact [facility] at [phone] with questions or concerns regarding your invoice.   IF you received labwork today, you will receive an invoice from [facility]. Please contact [facility] at [phone] with questions or concerns regarding your invoice.   Our billing staff will not be able to assist you with questions regarding bills from these companies.  You will be contacted with the lab results as soon as they are available. The fastest way to get your results is to activate your My Chart account. Instructions are located on the last page of this paperwork. If you have not heard from [place] regarding the results in 2 weeks, please contact this office.     Jock Itch  Jock itch is an itchy rash in the groin and upper thigh area. It is a skin infection that is caused by a type of germ that lives in dark, damp places (fungus). The rash usually goes away in 2-3 weeks with treatment. Follow these instructions at home: Skin care  Use skin creams, ointments, or powders exactly as told by your doctor.  Wear loose-fitting clothes. Clothes should not rub against your groin area. Men should wear boxer shorts or loose-fitting underwear.  Keep your groin area clean and dry. ? Change your underwear every day. ? Change out of wet bathing suits as soon as you can. ? After bathing, use a separate towel to dry your groin area. Dry the area gently and completely.  Avoid hot baths and showers. Hot water can make itching worse.  Do not scratch the area. General instructions  Take and apply over-the-counter and prescription medicines only as told by your doctor.  Do not share  towels or clothing with other people.  Wash your hands often with soap and water, especially after touching your groin area. If you do not have soap and water, use alcohol-based hand sanitizer. Contact a doctor if:  Your rash: ? Gets worse. ? Does not get better after 2 weeks of treatment. ? Spreads. ? Comes back after treatment is done.  You have any of the following: ? A fever. ? New or worsening redness, swelling, or pain around your rash. ? Fluid, blood, or pus coming from your rash. Summary  Jock itch is an itchy rash. It affects the groin and upper thigh area.  Jock itch usually goes away in 2-3 weeks with treatment.  Keep your groin area clean and dry. This information is not intended to replace advice given to you by your health care provider. Make sure you discuss any questions you have with your health care provider. Document Released: 02/14/2010 Document Revised: 10/31/2017 Document Reviewed: 10/31/2017 Elsevier Interactive Patient Education  2019 [employer].

## 2019-01-22 NOTE — Progress Notes (Signed)
Daniel Lamb 68 y.o.   Chief Complaint  Patient presents with  . Groin    itchy x 1 month "comes and goes" pt denies bumps or any other symptoms    HISTORY OF PRESENT ILLNESS: This is a 68 y.o. male complaining of a groin rash on and off for 1 month.  Has been diagnosed with jock itch before.  Patient goes to the gym almost on a daily basis and does not shower or change clothes until he gets home later during the day. Also has a history of herpes type II.  Requesting prescription for when he gets outbreaks.  Gets outbreaks once a year. HPI   Prior to Admission medications   Medication Sig Start Date End Date Taking? Authorizing Provider  lisinopril (PRINIVIL,ZESTRIL) 10 MG tablet Take 1 tablet (10 mg total) by mouth daily. 08/07/18   Jaynee Eagles, PA-C  sildenafil (REVATIO) 20 MG tablet Take 1-2 tablets (20-40 mg total) by mouth daily as needed. Take 1 hour prior to sexual activity for maximal effectiveness. 08/07/18   Jaynee Eagles, PA-C    No Known Allergies  Patient Active Problem List   Diagnosis Date Noted  . Essential hypertension 07/09/2018  . Elevated blood pressure reading 07/09/2018  . Erectile dysfunction 07/09/2018  . History of herpes genitalis 06/28/2015    Past Medical History:  Diagnosis Date  . Genital herpes   . Hypertension     Past Surgical History:  Procedure Laterality Date  . VASECTOMY      Social History   Socioeconomic History  . Marital status: Married    Spouse name: Not on file  . Number of children: Not on file  . Years of education: Not on file  . Highest education level: Not on file  Occupational History  . Not on file  Social Needs  . Financial resource strain: Not on file  . Food insecurity:    Worry: Not on file    Inability: Not on file  . Transportation needs:    Medical: Not on file    Non-medical: Not on file  Tobacco Use  . Smoking status: Never Smoker  . Smokeless tobacco: Never Used  Substance and Sexual Activity  .  Alcohol use: Yes  . Drug use: No  . Sexual activity: Not on file  Lifestyle  . Physical activity:    Days per week: Not on file    Minutes per session: Not on file  . Stress: Not on file  Relationships  . Social connections:    Talks on phone: Not on file    Gets together: Not on file    Attends religious service: Not on file    Active member of club or organization: Not on file    Attends meetings of clubs or organizations: Not on file    Relationship status: Not on file  . Intimate partner violence:    Fear of current or ex partner: Not on file    Emotionally abused: Not on file    Physically abused: Not on file    Forced sexual activity: Not on file  Other Topics Concern  . Not on file  Social History Narrative  . Not on file    Family History  Problem Relation Age of Onset  . Cancer Mother   . Cancer Father      Review of Systems  Constitutional: Negative.  Negative for chills and fever.  HENT: Negative for sore throat.   Respiratory: Negative for shortness  of breath.   Cardiovascular: Negative for chest pain and palpitations.  Gastrointestinal: Negative for abdominal pain, diarrhea, nausea and vomiting.  Genitourinary: Negative for dysuria.  Skin: Positive for itching and rash.  All other systems reviewed and are negative.   Vitals:   01/22/19 0818 01/22/19 0834  BP: (!) 172/86 (!) 150/86  Pulse: (!) 59   Resp: 16   Temp: 98.7 F (37.1 C)   SpO2: 98%     Physical Exam Vitals signs reviewed.  Constitutional:      Appearance: Normal appearance.  HENT:     Head: Normocephalic and atraumatic.     Nose: Nose normal.     Mouth/Throat:     Mouth: Mucous membranes are moist.     Pharynx: Oropharynx is clear.  Eyes:     Extraocular Movements: Extraocular movements intact.     Conjunctiva/sclera: Conjunctivae normal.     Pupils: Pupils are equal, round, and reactive to light.  Neck:     Musculoskeletal: Normal range of motion and neck supple.    Cardiovascular:     Rate and Rhythm: Normal rate and regular rhythm.     Heart sounds: Normal heart sounds.  Pulmonary:     Effort: Pulmonary effort is normal.     Breath sounds: Normal breath sounds.  Abdominal:     Palpations: Abdomen is soft.     Tenderness: There is no abdominal tenderness.  Skin:    Capillary Refill: Capillary refill takes less than 2 seconds.     Comments: Positive tinea cruris more on the left than the right side.  Neurological:     General: No focal deficit present.     Mental Status: He is alert and oriented to person, place, and time.  Psychiatric:        Mood and Affect: Mood normal.        Behavior: Behavior normal.      ASSESSMENT & PLAN: Daniel Lamb was seen today for groin.  Diagnoses and all orders for this visit:  Tinea cruris -     clotrimazole-betamethasone (LOTRISONE) cream; Apply 1 application topically 2 (two) times daily. -     terbinafine (LAMISIL) 250 MG tablet; Take 1 tablet (250 mg total) by mouth daily for 7 days.  History of herpes simplex type 2 infection -     valACYclovir (VALTREX) 1000 MG tablet; Take 1 tablet (1,000 mg total) by mouth 2 (two) times daily.    Patient Instructions       If you have lab work done today you will be contacted with your lab results within the next 2 weeks.  If you have not heard from [place] then please contact us. The fastest way to get your results is to register for My Chart.   IF you received an x-ray today, you will receive an invoice from [facility]. Please contact [facility] at [phone] with questions or concerns regarding your invoice.   IF you received labwork today, you will receive an invoice from [place]. Please contact [facility] at [phone] with questions or concerns regarding your invoice.   Our billing staff will not be able to assist you with questions regarding bills from these companies.  You will be contacted with the lab results as soon as they are  available. The fastest way to get your results is to activate your My Chart account. Instructions are located on the last page of this paperwork. If you have not heard from [place] regarding the results in 2 weeks,  please contact this office.     Jock Itch  Jock itch is an itchy rash in the groin and upper thigh area. It is a skin infection that is caused by a type of germ that lives in dark, damp places (fungus). The rash usually goes away in 2-3 weeks with treatment. Follow these instructions at home: Skin care  Use skin creams, ointments, or powders exactly as told by your doctor.  Wear loose-fitting clothes. Clothes should not rub against your groin area. Men should wear boxer shorts or loose-fitting underwear.  Keep your groin area clean and dry. ? Change your underwear every day. ? Change out of wet bathing suits as soon as you can. ? After bathing, use a separate towel to dry your groin area. Dry the area gently and completely.  Avoid hot baths and showers. Hot water can make itching worse.  Do not scratch the area. General instructions  Take and apply over-the-counter and prescription medicines only as told by your doctor.  Do not share towels or clothing with other people.  Wash your hands often with soap and water, especially after touching your groin area. If you do not have soap and water, use alcohol-based hand sanitizer. Contact a doctor if:  Your rash: ? Gets worse. ? Does not get better after 2 weeks of treatment. ? Spreads. ? Comes back after treatment is done.  You have any of the following: ? A fever. ? New or worsening redness, swelling, or pain around your rash. ? Fluid, blood, or pus coming from your rash. Summary  Jock itch is an itchy rash. It affects the groin and upper thigh area.  Jock itch usually goes away in 2-3 weeks with treatment.  Keep your groin area clean and dry. This information is not intended to replace advice given to you by your  health care provider. Make sure you discuss any questions you have with your health care provider. Document Released: 02/14/2010 Document Revised: 10/31/2017 Document Reviewed: 10/31/2017 Elsevier Interactive Patient Education  2019 Elsevier Inc.      Agustina Caroli, MD [facility]

## 2019-02-26 ENCOUNTER — Encounter: Payer: Self-pay | Admitting: Emergency Medicine

## 2019-08-06 DIAGNOSIS — Z20828 Contact with and (suspected) exposure to other viral communicable diseases: Secondary | ICD-10-CM | POA: Diagnosis not present

## 2019-08-30 DIAGNOSIS — Z23 Encounter for immunization: Secondary | ICD-10-CM | POA: Diagnosis not present

## 2019-09-16 ENCOUNTER — Telehealth: Payer: Self-pay | Admitting: Emergency Medicine

## 2019-09-16 NOTE — Telephone Encounter (Signed)
Please schedule

## 2019-09-16 NOTE — Telephone Encounter (Signed)
Pt is needing a refill of his lisinopril (he is completely out). I told him that he may need an appointment but wanted to wait to see what clinical says before scheduling. Please advise.

## 2019-09-16 NOTE — Telephone Encounter (Signed)
Will send 30 day supply but he will need appointment for further refills.

## 2019-09-18 ENCOUNTER — Ambulatory Visit (INDEPENDENT_AMBULATORY_CARE_PROVIDER_SITE_OTHER): Payer: Medicare Other | Admitting: Emergency Medicine

## 2019-09-18 ENCOUNTER — Other Ambulatory Visit: Payer: Self-pay

## 2019-09-18 ENCOUNTER — Encounter: Payer: Self-pay | Admitting: Emergency Medicine

## 2019-09-18 VITALS — BP 162/84 | HR 57 | Temp 98.2°F | Ht 66.0 in | Wt 154.4 lb

## 2019-09-18 DIAGNOSIS — I10 Essential (primary) hypertension: Secondary | ICD-10-CM

## 2019-09-18 DIAGNOSIS — Z760 Encounter for issue of repeat prescription: Secondary | ICD-10-CM

## 2019-09-18 MED ORDER — LISINOPRIL 20 MG PO TABS: 20.0000 mg | ORAL_TABLET | Freq: Every day | ORAL | 3 refills | Status: DC

## 2019-09-18 NOTE — Assessment & Plan Note (Signed)
Elevated systolic blood pressure.  Will increase lisinopril to 20 mg daily.

## 2019-09-18 NOTE — Patient Instructions (Addendum)
   If you have lab work done today you will be contacted with your lab results within the next 2 weeks.  If you have not heard from us then please contact us. The fastest way to get your results is to register for My Chart.   IF you received an x-ray today, you will receive an invoice from [facility]. Please contact [facility] at [phone] with questions or concerns regarding your invoice.   IF you received labwork today, you will receive an invoice from [facility]. Please contact [facility] at [phone] with questions or concerns regarding your invoice.   Our billing staff will not be able to assist you with questions regarding bills from these companies.  You will be contacted with the lab results as soon as they are available. The fastest way to get your results is to activate your My Chart account. Instructions are located on the last page of this paperwork. If you have not heard from us regarding the results in 2 weeks, please contact this office.     Hypertension, Adult High blood pressure (hypertension) is when the force of blood pumping through the arteries is too strong. The arteries are the blood vessels that carry blood from the heart throughout the body. Hypertension forces the heart to work harder to pump blood and may cause arteries to become narrow or stiff. Untreated or uncontrolled hypertension can cause a heart attack, heart failure, a stroke, kidney disease, and other problems. A blood pressure reading consists of a higher number over a lower number. Ideally, your blood pressure should be below 120/80. The first ("top") number is called the systolic pressure. It is a measure of the pressure in your arteries as your heart beats. The second ("bottom") number is called the diastolic pressure. It is a measure of the pressure in your arteries as the heart relaxes. What are the causes? The exact cause of this condition is not known. There are some conditions  that result in or are related to high blood pressure. What increases the risk? Some risk factors for high blood pressure are under your control. The following factors may make you more likely to develop this condition:  Smoking.  Having type 2 diabetes mellitus, high cholesterol, or both.  Not getting enough exercise or physical activity.  Being overweight.  Having too much fat, sugar, calories, or salt (sodium) in your diet.  Drinking too much alcohol. Some risk factors for high blood pressure may be difficult or impossible to change. Some of these factors include:  Having chronic kidney disease.  Having a family history of high blood pressure.  Age. Risk increases with age.  Race. You may be at higher risk if you are African American.  Gender. Men are at higher risk than women before age 45. After age 65, women are at higher risk than men.  Having obstructive sleep apnea.  Stress. What are the signs or symptoms? High blood pressure may not cause symptoms. Very high blood pressure (hypertensive crisis) may cause:  Headache.  Anxiety.  Shortness of breath.  Nosebleed.  Nausea and vomiting.  Vision changes.  Severe chest pain.  Seizures. How is this diagnosed? This condition is diagnosed by measuring your blood pressure while you are seated, with your arm resting on a flat surface, your legs uncrossed, and your feet flat on the floor. The cuff of the blood pressure monitor will be placed directly against the skin of your upper arm at the level of your heart.   It should be measured at least twice using the same arm. Certain conditions can cause a difference in blood pressure between your right and left arms. Certain factors can cause blood pressure readings to be lower or higher than normal for a short period of time:  When your blood pressure is higher when you are in a health care provider's office than when you are at home, this is called white coat hypertension.  Most people with this condition do not need medicines.  When your blood pressure is higher at home than when you are in a health care provider's office, this is called masked hypertension. Most people with this condition may need medicines to control blood pressure. If you have a high blood pressure reading during one visit or you have normal blood pressure with other risk factors, you may be asked to:  Return on a different day to have your blood pressure checked again.  Monitor your blood pressure at home for 1 week or longer. If you are diagnosed with hypertension, you may have other blood or imaging tests to help your health care provider understand your overall risk for other conditions. How is this treated? This condition is treated by making healthy lifestyle changes, such as eating healthy foods, exercising more, and reducing your alcohol intake. Your health care provider may prescribe medicine if lifestyle changes are not enough to get your blood pressure under control, and if:  Your systolic blood pressure is above 130.  Your diastolic blood pressure is above 80. Your personal target blood pressure may vary depending on your medical conditions, your age, and other factors. Follow these instructions at home: Eating and drinking   Eat a diet that is high in fiber and potassium, and low in sodium, added sugar, and fat. An example eating plan is called the DASH (Dietary Approaches to Stop Hypertension) diet. To eat this way: ? Eat plenty of fresh fruits and vegetables. Try to fill one half of your plate at each meal with fruits and vegetables. ? Eat whole grains, such as whole-wheat pasta, brown rice, or whole-grain bread. Fill about one fourth of your plate with whole grains. ? Eat or drink low-fat dairy products, such as skim milk or low-fat yogurt. ? Avoid fatty cuts of meat, processed or cured meats, and poultry with skin. Fill about one fourth of your plate with lean proteins, such  as fish, chicken without skin, beans, eggs, or tofu. ? Avoid pre-made and processed foods. These tend to be higher in sodium, added sugar, and fat.  Reduce your daily sodium intake. Most people with hypertension should eat less than 1,500 mg of sodium a day.  Do not drink alcohol if: ? Your health care provider tells you not to drink. ? You are pregnant, may be pregnant, or are planning to become pregnant.  If you drink alcohol: ? Limit how much you use to:  0-1 drink a day for women.  0-2 drinks a day for men. ? Be aware of how much alcohol is in your drink. In the [place] one drink equals one 12 oz bottle of beer (355 mL), one 5 oz glass of wine (148 mL), or one 1 oz glass of hard liquor (44 mL). Lifestyle   Work with your health care provider to maintain a healthy body weight or to lose weight. Ask what an ideal weight is for you.  Get at least 30 minutes of exercise most days of the week. Activities may include walking, swimming, or   biking.  Include exercise to strengthen your muscles (resistance exercise), such as Pilates or lifting weights, as part of your weekly exercise routine. Try to do these types of exercises for 30 minutes at least 3 days a week.  Do not use any products that contain nicotine or tobacco, such as cigarettes, e-cigarettes, and chewing tobacco. If you need help quitting, ask your health care provider.  Monitor your blood pressure at home as told by your health care provider.  Keep all follow-up visits as told by your health care provider. This is important. Medicines  Take over-the-counter and prescription medicines only as told by your health care provider. Follow directions carefully. Blood pressure medicines must be taken as prescribed.  Do not skip doses of blood pressure medicine. Doing this puts you at risk for problems and can make the medicine less effective.  Ask your health care provider about side effects or reactions to medicines that you  should watch for. Contact a health care provider if you:  Think you are having a reaction to a medicine you are taking.  Have headaches that keep coming back (recurring).  Feel dizzy.  Have swelling in your ankles.  Have trouble with your vision. Get help right away if you:  Develop a severe headache or confusion.  Have unusual weakness or numbness.  Feel faint.  Have severe pain in your chest or abdomen.  Vomit repeatedly.  Have trouble breathing. Summary  Hypertension is when the force of blood pumping through your arteries is too strong. If this condition is not controlled, it may put you at risk for serious complications.  Your personal target blood pressure may vary depending on your medical conditions, your age, and other factors. For most people, a normal blood pressure is less than 120/80.  Hypertension is treated with lifestyle changes, medicines, or a combination of both. Lifestyle changes include losing weight, eating a healthy, low-sodium diet, exercising more, and limiting alcohol. This information is not intended to replace advice given to you by your health care provider. Make sure you discuss any questions you have with your health care provider. Document Released: 11/20/2005 Document Revised: 07/31/2018 Document Reviewed: 07/31/2018 Elsevier Patient Education  2020 [employer] Inc.  

## 2019-09-18 NOTE — Telephone Encounter (Signed)
Patient calling back about 30 day supply. It is not at pharmacy.

## 2019-09-18 NOTE — Progress Notes (Signed)
BP Readings from Last 3 Encounters:  09/18/19 (!) 162/84  01/22/19 (!) 150/86  09/02/18 (!) 145/88   Daniel Lamb 68 y.o.   Chief Complaint  Patient presents with  . Medication Refill    Lisinopril    HISTORY OF PRESENT ILLNESS: This is a 68 y.o. male with history of hypertension here for follow-up and medication refill.  Presently taking lisinopril 10 mg daily. Systolic blood pressure on record have been consistently elevated. HPI   Prior to Admission medications   Medication Sig Start Date End Date Taking? Authorizing Provider  sildenafil (REVATIO) 20 MG tablet Take 1-2 tablets (20-40 mg total) by mouth daily as needed. Take 1 hour prior to sexual activity for maximal effectiveness. 08/07/18  Yes Daniel Eagles, PA-C  valACYclovir (VALTREX) 1000 MG tablet Take 1 tablet (1,000 mg total) by mouth 2 (two) times daily. 01/22/19  Yes Daniel Lamb, Daniel Bloomer, MD  clotrimazole-betamethasone (LOTRISONE) cream Apply 1 application topically 2 (two) times daily. Patient not taking: Reported on 09/18/2019 01/22/19   Daniel Pollen, MD  lisinopril (ZESTRIL) 20 MG tablet Take 1 tablet (20 mg total) by mouth daily. 09/18/19   Daniel Pollen, MD    No Known Allergies  Patient Active Problem List   Diagnosis Date Noted  . Essential hypertension 07/09/2018  . Erectile dysfunction 07/09/2018  . History of herpes genitalis 06/28/2015    Past Medical History:  Diagnosis Date  . Genital herpes   . Hypertension     Past Surgical History:  Procedure Laterality Date  . VASECTOMY      Social History   Socioeconomic History  . Marital status: Married    Spouse name: Not on file  . Number of children: Not on file  . Years of education: Not on file  . Highest education level: Not on file  Occupational History  . Not on file  Social Needs  . Financial resource strain: Not on file  . Food insecurity    Worry: Not on file    Inability: Not on file  . Transportation needs    Medical: Not on file    Non-medical: Not on file  Tobacco Use  . Smoking status: Never Smoker  . Smokeless tobacco: Never Used  Substance and Sexual Activity  . Alcohol use: Yes  . Drug use: No  . Sexual activity: Not on file  Lifestyle  . Physical activity    Days per week: Not on file    Minutes per session: Not on file  . Stress: Not on file  Relationships  . Social Herbalist on phone: Not on file    Gets together: Not on file    Attends religious service: Not on file    Active member of club or organization: Not on file    Attends meetings of clubs or organizations: Not on file    Relationship status: Not on file  . Intimate partner violence    Fear of current or ex partner: Not on file    Emotionally abused: Not on file    Physically abused: Not on file    Forced sexual activity: Not on file  Other Topics Concern  . Not on file  Social History Narrative  . Not on file    Family History  Problem Relation Age of Onset  . Cancer Mother   . Cancer Father      Review of Systems  Constitutional: Negative.  Negative for chills and fever.  HENT:  Negative.  Negative for congestion and sore throat.   Respiratory: Negative.  Negative for cough and shortness of breath.   Cardiovascular: Negative.  Negative for chest pain and palpitations.  Gastrointestinal: Negative.  Negative for abdominal pain, nausea and vomiting.  Genitourinary: Negative.  Negative for dysuria and hematuria.  Skin: Negative.  Negative for rash.  Neurological: Negative.  Negative for dizziness and headaches.  All other systems reviewed and are negative.  Today's Vitals   09/18/19 1128  BP: (!) 162/84  Pulse: (!) 57  Temp: 98.2 F (36.8 C)  TempSrc: Oral  Weight: 154 lb 6.4 oz (70 kg)  Height: 5\' 6"  (1.676 m)   Body mass index is 24.92 kg/m.   Physical Exam Vitals signs reviewed.  Constitutional:      Appearance: Normal appearance.  HENT:     Head: Normocephalic.  Eyes:      Extraocular Movements: Extraocular movements intact.     Pupils: Pupils are equal, round, and reactive to light.  Neck:     Musculoskeletal: Normal range of motion.  Cardiovascular:     Rate and Rhythm: Normal rate and regular rhythm.     Pulses: Normal pulses.     Heart sounds: Normal heart sounds.  Musculoskeletal: Normal range of motion.  Skin:    General: Skin is warm.  Neurological:     General: No focal deficit present.     Mental Status: He is alert and oriented to person, place, and time.  Psychiatric:        Mood and Affect: Mood normal.        Behavior: Behavior normal.      ASSESSMENT & PLAN: Essential hypertension Elevated systolic blood pressure.  Will increase lisinopril to 20 mg daily.  Daniel Lamb was seen today for medication refill.  Diagnoses and all orders for this visit:  Essential hypertension -     lisinopril (ZESTRIL) 20 MG tablet; Take 1 tablet (20 mg total) by mouth daily.  Encounter for medication refill     Patient Instructions       If you have lab work done today you will be contacted with your lab results within the next 2 weeks.  If you have not heard from [place] then please contact us. The fastest way to get your results is to register for My Chart.   IF you received an x-ray today, you will receive an invoice from [facility]. Please contact [facility] at [phone] with questions or concerns regarding your invoice.   IF you received labwork today, you will receive an invoice from [place]. Please contact [facility] at [phone] with questions or concerns regarding your invoice.   Our billing staff will not be able to assist you with questions regarding bills from these companies.  You will be contacted with the lab results as soon as they are available. The fastest way to get your results is to activate your My Chart account. Instructions are located on the last page of this paperwork. If you have not heard from [place]  regarding the results in 2 weeks, please contact this office.     Hypertension, Adult High blood pressure (hypertension) is when the force of blood pumping through the arteries is too strong. The arteries are the blood vessels that carry blood from the heart throughout the body. Hypertension forces the heart to work harder to pump blood and may cause arteries to become narrow or stiff. Untreated or uncontrolled hypertension can cause a heart attack, heart failure, a  stroke, kidney disease, and other problems. A blood pressure reading consists of a higher number over a lower number. Ideally, your blood pressure should be below 120/80. The first ("top") number is called the systolic pressure. It is a measure of the pressure in your arteries as your heart beats. The second ("bottom") number is called the diastolic pressure. It is a measure of the pressure in your arteries as the heart relaxes. What are the causes? The exact cause of this condition is not known. There are some conditions that result in or are related to high blood pressure. What increases the risk? Some risk factors for high blood pressure are under your control. The following factors may make you more likely to develop this condition:  Smoking.  Having type 2 diabetes mellitus, high cholesterol, or both.  Not getting enough exercise or physical activity.  Being overweight.  Having too much fat, sugar, calories, or salt (sodium) in your diet.  Drinking too much alcohol. Some risk factors for high blood pressure may be difficult or impossible to change. Some of these factors include:  Having chronic kidney disease.  Having a family history of high blood pressure.  Age. Risk increases with age.  Race. You may be at higher risk if you are African American.  Gender. Men are at higher risk than women before age 12. After age 12, women are at higher risk than men.  Having obstructive sleep apnea.  Stress. What are the  signs or symptoms? High blood pressure may not cause symptoms. Very high blood pressure (hypertensive crisis) may cause:  Headache.  Anxiety.  Shortness of breath.  Nosebleed.  Nausea and vomiting.  Vision changes.  Severe chest pain.  Seizures. How is this diagnosed? This condition is diagnosed by measuring your blood pressure while you are seated, with your arm resting on a flat surface, your legs uncrossed, and your feet flat on the floor. The cuff of the blood pressure monitor will be placed directly against the skin of your upper arm at the level of your heart. It should be measured at least twice using the same arm. Certain conditions can cause a difference in blood pressure between your right and left arms. Certain factors can cause blood pressure readings to be lower or higher than normal for a short period of time:  When your blood pressure is higher when you are in a health care provider's office than when you are at home, this is called white coat hypertension. Most people with this condition do not need medicines.  When your blood pressure is higher at home than when you are in a health care provider's office, this is called masked hypertension. Most people with this condition may need medicines to control blood pressure. If you have a high blood pressure reading during one visit or you have normal blood pressure with other risk factors, you may be asked to:  Return on a different day to have your blood pressure checked again.  Monitor your blood pressure at home for 1 week or longer. If you are diagnosed with hypertension, you may have other blood or imaging tests to help your health care provider understand your overall risk for other conditions. How is this treated? This condition is treated by making healthy lifestyle changes, such as eating healthy foods, exercising more, and reducing your alcohol intake. Your health care provider may prescribe medicine if lifestyle  changes are not enough to get your blood pressure under control, and if:  Your  systolic blood pressure is above 130.  Your diastolic blood pressure is above 80. Your personal target blood pressure may vary depending on your medical conditions, your age, and other factors. Follow these instructions at home: Eating and drinking   Eat a diet that is high in fiber and potassium, and low in sodium, added sugar, and fat. An example eating plan is called the DASH (Dietary Approaches to Stop Hypertension) diet. To eat this way: ? Eat plenty of fresh fruits and vegetables. Try to fill one half of your plate at each meal with fruits and vegetables. ? Eat whole grains, such as whole-wheat pasta, brown rice, or whole-grain bread. Fill about one fourth of your plate with whole grains. ? Eat or drink low-fat dairy products, such as skim milk or low-fat yogurt. ? Avoid fatty cuts of meat, processed or cured meats, and poultry with skin. Fill about one fourth of your plate with lean proteins, such as fish, chicken without skin, beans, eggs, or tofu. ? Avoid pre-made and processed foods. These tend to be higher in sodium, added sugar, and fat.  Reduce your daily sodium intake. Most people with hypertension should eat less than 1,500 mg of sodium a day.  Do not drink alcohol if: ? Your health care provider tells you not to drink. ? You are pregnant, may be pregnant, or are planning to become pregnant.  If you drink alcohol: ? Limit how much you use to:  0-1 drink a day for women.  0-2 drinks a day for men. ? Be aware of how much alcohol is in your drink. In the [place] one drink equals one 12 oz bottle of beer (355 mL), one 5 oz glass of wine (148 mL), or one 1 oz glass of hard liquor (44 mL). Lifestyle   Work with your health care provider to maintain a healthy body weight or to lose weight. Ask what an ideal weight is for you.  Get at least 30 minutes of exercise most days of the week. Activities  may include walking, swimming, or biking.  Include exercise to strengthen your muscles (resistance exercise), such as Pilates or lifting weights, as part of your weekly exercise routine. Try to do these types of exercises for 30 minutes at least 3 days a week.  Do not use any products that contain nicotine or tobacco, such as cigarettes, e-cigarettes, and chewing tobacco. If you need help quitting, ask your health care provider.  Monitor your blood pressure at home as told by your health care provider.  Keep all follow-up visits as told by your health care provider. This is important. Medicines  Take over-the-counter and prescription medicines only as told by your health care provider. Follow directions carefully. Blood pressure medicines must be taken as prescribed.  Do not skip doses of blood pressure medicine. Doing this puts you at risk for problems and can make the medicine less effective.  Ask your health care provider about side effects or reactions to medicines that you should watch for. Contact a health care provider if you:  Think you are having a reaction to a medicine you are taking.  Have headaches that keep coming back (recurring).  Feel dizzy.  Have swelling in your ankles.  Have trouble with your vision. Get help right away if you:  Develop a severe headache or confusion.  Have unusual weakness or numbness.  Feel faint.  Have severe pain in your chest or abdomen.  Vomit repeatedly.  Have trouble breathing. Summary  Hypertension is when the force of blood pumping through your arteries is too strong. If this condition is not controlled, it may put you at risk for serious complications.  Your personal target blood pressure may vary depending on your medical conditions, your age, and other factors. For most people, a normal blood pressure is less than 120/80.  Hypertension is treated with lifestyle changes, medicines, or a combination of both. Lifestyle  changes include losing weight, eating a healthy, low-sodium diet, exercising more, and limiting alcohol. This information is not intended to replace advice given to you by your health care provider. Make sure you discuss any questions you have with your health care provider. Document Released: 11/20/2005 Document Revised: 07/31/2018 Document Reviewed: 07/31/2018 Elsevier Patient Education  2020 Elsevier Inc.      Agustina Caroli, MD [facility]

## 2019-09-22 NOTE — Telephone Encounter (Signed)
Had appt on 09/18/2019 for med refills

## 2019-12-04 ENCOUNTER — Telehealth: Payer: Self-pay | Admitting: Emergency Medicine

## 2019-12-04 NOTE — Telephone Encounter (Signed)
Requested medication (s) are due for refill today: yes  Requested medication (s) are on the active medication list: yes   Future visit scheduled: no  Notes to clinic: review for refill Last filled by different provider    Requested Prescriptions  Pending Prescriptions Disp Refills   sildenafil (REVATIO) 20 MG tablet 30 tablet 11    Sig: Take 1-2 tablets (20-40 mg total) by mouth daily as needed. Take 1 hour prior to sexual activity for maximal effectiveness.      Urology: Erectile Dysfunction Agents Failed - 12/04/2019  9:42 AM      Failed - Last BP in normal range    BP Readings from Last 1 Encounters:  09/18/19 (!) 162/84          Passed - Valid encounter within last 12 months    Recent Outpatient Visits           2 months ago Essential hypertension   [facility] at [place], [place], MD   10 months ago Tinea cruris   Primary Care at [facility], Ines Bloomer, MD   1 year ago Essential hypertension   Primary Care at [place], [place]   1 year ago Annual physical exam   Primary Care at [place], [place]   4 years ago Pain in tooth   Primary Care at [facility], Audrie Lia, PA-C

## 2019-12-04 NOTE — Telephone Encounter (Signed)
sildenafil (REVATIO) 20 MG tablet     Patient is requesting refill.     Pharmacy:  [facility] [address] [place], [place] Phone:  [phone]  Fax:  [phone]

## 2019-12-08 ENCOUNTER — Other Ambulatory Visit: Payer: Self-pay | Admitting: *Deleted

## 2019-12-08 MED ORDER — SILDENAFIL CITRATE 20 MG PO TABS: 20.0000 mg | ORAL_TABLET | Freq: Every day | ORAL | 1 refills | Status: DC | PRN

## 2019-12-11 ENCOUNTER — Telehealth: Payer: Self-pay

## 2019-12-11 MED ORDER — SILDENAFIL CITRATE 20 MG PO TABS: 20.0000 mg | ORAL_TABLET | Freq: Every day | ORAL | 1 refills | Status: DC | PRN

## 2019-12-11 NOTE — Telephone Encounter (Signed)
Pt stated sildenafil (REVATIO) 20 MG tablet  Was sent to the wrong pharmacy.  Please transfer to  [facility] [place], [place] Phone:  [phone]  Fax:  [phone]

## 2019-12-11 NOTE — Telephone Encounter (Signed)
Medication sent to correct pharmacy  

## 2019-12-12 ENCOUNTER — Other Ambulatory Visit: Payer: Self-pay | Admitting: Emergency Medicine

## 2019-12-12 ENCOUNTER — Telehealth: Payer: Self-pay | Admitting: Emergency Medicine

## 2019-12-12 NOTE — Telephone Encounter (Signed)
Pharmacist from [employer] is needing [place] to resend pt's  sildenafil (REVATIO) 20 MG tablet [id number]. Epic  says we printed it, but pt says he doesn't have it. Pharmacy  [facility] [place], [place]  [id number], [place] [address]   Please advise at [phone] if needed.

## 2019-12-17 DIAGNOSIS — Z23 Encounter for immunization: Secondary | ICD-10-CM | POA: Diagnosis not present

## 2020-01-14 DIAGNOSIS — Z23 Encounter for immunization: Secondary | ICD-10-CM | POA: Diagnosis not present

## 2020-06-24 ENCOUNTER — Telehealth: Payer: Self-pay | Admitting: *Deleted

## 2020-06-24 NOTE — Telephone Encounter (Signed)
Schedule AWV.  

## 2020-07-06 ENCOUNTER — Other Ambulatory Visit: Payer: Self-pay

## 2020-07-06 ENCOUNTER — Telehealth (INDEPENDENT_AMBULATORY_CARE_PROVIDER_SITE_OTHER): Payer: Medicare Other | Admitting: Emergency Medicine

## 2020-07-06 ENCOUNTER — Encounter: Payer: Self-pay | Admitting: Emergency Medicine

## 2020-07-06 VITALS — Ht 66.0 in | Wt 150.0 lb

## 2020-07-06 DIAGNOSIS — B9789 Other viral agents as the cause of diseases classified elsewhere: Secondary | ICD-10-CM

## 2020-07-06 DIAGNOSIS — J029 Acute pharyngitis, unspecified: Secondary | ICD-10-CM

## 2020-07-06 DIAGNOSIS — J988 Other specified respiratory disorders: Secondary | ICD-10-CM

## 2020-07-06 DIAGNOSIS — R05 Cough: Secondary | ICD-10-CM | POA: Diagnosis not present

## 2020-07-06 DIAGNOSIS — R059 Cough, unspecified: Secondary | ICD-10-CM

## 2020-07-06 NOTE — Progress Notes (Addendum)
Telemedicine Encounter- SOAP NOTE Established Patient MyChart video encounter This video telephone encounter was conducted with the patient's (or proxy's) verbal consent via video audio telecommunications: yes/no: Yes Patient was instructed to have this encounter in a suitably private space; and to only have persons present to whom they give permission to participate. In addition, patient identity was confirmed by use of name plus two identifiers (DOB and address).  I discussed the limitations, risks, security and privacy concerns of performing an evaluation and management service by telephone and the availability of in person appointments. I also discussed with the patient that there may be a patient responsible charge related to this service. The patient expressed understanding and agreed to proceed.  I spent a total of TIME; 0 MIN TO 60 MIN: 10 minutes talking with the patient or their proxy.  Chief Complaint  Patient presents with  . Cough    STARTED LAST NIGHT WITH A SCRATCHY THROAT, per patient he has been vaccinated - Moderna    Subjective   WILFREDO CANTERBURY is a 69 y.o. male established patient. Telephone visit today complaining of dry cough and scratchy throat that started last evening.  Denies fever or chills.  Fully vaccinated against Covid.  No other significant associated symptoms.  HPI   Patient Active Problem List   Diagnosis Date Noted  . Essential hypertension 07/09/2018  . Erectile dysfunction 07/09/2018  . History of herpes genitalis 06/28/2015    Past Medical History:  Diagnosis Date  . Genital herpes   . Hypertension     Current Outpatient Medications  Medication Sig Dispense Refill  . clotrimazole-betamethasone (LOTRISONE) cream Apply 1 application topically 2 (two) times daily. 30 g 2  . lisinopril (ZESTRIL) 20 MG tablet Take 1 tablet (20 mg total) by mouth daily. 90 tablet 3  . sildenafil (REVATIO) 20 MG tablet TAKE 1-2 TABLETS BY MOUTH DAILY AS  NEEDED 1 HR PRIOR TO SEXUAL ACTIVITY FOR MAXIMAL EFFECTIVENESS. 30 tablet 11  . valACYclovir (VALTREX) 1000 MG tablet Take 1 tablet (1,000 mg total) by mouth 2 (two) times daily. 20 tablet 0   No current facility-administered medications for this visit.    No Known Allergies  Social History   Socioeconomic History  . Marital status: Married    Spouse name: Not on file  . Number of children: Not on file  . Years of education: Not on file  . Highest education level: Not on file  Occupational History  . Not on file  Tobacco Use  . Smoking status: Never Smoker  . Smokeless tobacco: Never Used  Substance and Sexual Activity  . Alcohol use: Yes  . Drug use: No  . Sexual activity: Not on file  Other Topics Concern  . Not on file  Social History Narrative  . Not on file   Social Determinants of Health   Financial Resource Strain:   . Difficulty of Paying Living Expenses:   Food Insecurity:   . Worried About Charity fundraiser in the Last Year:   . Arboriculturist in the Last Year:   Transportation Needs:   . Film/video editor (Medical):   Marland Kitchen Lack of Transportation (Non-Medical):   Physical Activity:   . Days of Exercise per Week:   . Minutes of Exercise per Session:   Stress:   . Feeling of Stress :   Social Connections:   . Frequency of Communication with Friends and Family:   . Frequency of Social Gatherings  with Friends and Family:   . Attends Religious Services:   . Active Member of Clubs or Organizations:   . Attends Archivist Meetings:   Marland Kitchen Marital Status:   Intimate Partner Violence:   . Fear of Current or Ex-Partner:   . Emotionally Abused:   Marland Kitchen Physically Abused:   . Sexually Abused:     Review of Systems  Constitutional: Negative.  Negative for chills and fever.  HENT: Positive for congestion and sore throat.   Respiratory: Positive for cough. Negative for shortness of breath.   Cardiovascular: Negative.  Negative for chest pain and  palpitations.  Gastrointestinal: Negative for abdominal pain, diarrhea, nausea and vomiting.  Skin: Negative.  Negative for rash.  Neurological: Negative for dizziness and headaches.  All other systems reviewed and are negative.   Objective  Alert and oriented x3 in no apparent respiratory distress. Vitals as reported by the patient: Today's Vitals   07/06/20 1624  Weight: 150 lb (68 kg)  Height: 5\' 6"  (1.676 m)    There are no diagnoses linked to this encounter. Lauro was seen today for cough.  Diagnoses and all orders for this visit:  Viral respiratory infection  Cough  Sore throat   Patient: Home  Provider: Office       I discussed the assessment and treatment plan with the patient. The patient was provided an opportunity to ask questions and all were answered. The patient agreed with the plan and demonstrated an understanding of the instructions.   The patient was advised to call back or seek an in-person evaluation if the symptoms worsen or if the condition fails to improve as anticipated.  I provided 10 minutes of non-face-to-face time during this encounter.  Horald Pollen, MD  Primary Care at [facility]

## 2020-07-06 NOTE — Patient Instructions (Signed)
° ° ° °  If you have lab work done today you will be contacted with your lab results within the next 2 weeks.  If you have not heard from us then please contact us. The fastest way to get your results is to register for My Chart. ° ° °IF you received an x-ray today, you will receive an invoice from [facility]. Please contact [facility] at [phone] with questions or concerns regarding your invoice.  ° °IF you received labwork today, you will receive an invoice from [facility]. Please contact [facility] at [phone] with questions or concerns regarding your invoice.  ° °Our billing staff will not be able to assist you with questions regarding bills from these companies. ° °You will be contacted with the lab results as soon as they are available. The fastest way to get your results is to activate your My Chart account. Instructions are located on the last page of this paperwork. If you have not heard from us regarding the results in 2 weeks, please contact this office. °  ° ° ° °

## 2020-07-07 DIAGNOSIS — Z20822 Contact with and (suspected) exposure to covid-19: Secondary | ICD-10-CM | POA: Diagnosis not present

## 2020-07-08 ENCOUNTER — Other Ambulatory Visit: Payer: Medicare Other

## 2020-09-28 ENCOUNTER — Other Ambulatory Visit: Payer: Self-pay | Admitting: Emergency Medicine

## 2020-09-28 DIAGNOSIS — I10 Essential (primary) hypertension: Secondary | ICD-10-CM

## 2020-11-03 ENCOUNTER — Other Ambulatory Visit: Payer: Self-pay | Admitting: Emergency Medicine

## 2020-11-03 DIAGNOSIS — Z8619 Personal history of other infectious and parasitic diseases: Secondary | ICD-10-CM

## 2020-11-03 NOTE — Telephone Encounter (Signed)
Requested medication (s) are due for refill today: expired medication  Requested medication (s) are on the active medication list: yes   Last refill:  01/22/2019 #20 0 refills   Future visit scheduled: no   Notes to clinic:  expired medication. Do you want to renew Rx?     Requested Prescriptions  Pending Prescriptions Disp Refills   valACYclovir (VALTREX) 1000 MG tablet [Pharmacy Med Name: VALACYCLOVIR HCL 1 GRAM TABLET] 20 tablet 0    Sig: TAKE 1 TABLET BY MOUTH TWICE A DAY      Antimicrobials:  Antiviral Agents - Anti-Herpetic Passed - 11/03/2020 11:44 AM      Passed - Valid encounter within last 12 months    Recent Outpatient Visits           4 months ago Viral respiratory infection   Primary Care at [facility], Ines Bloomer, MD   1 year ago Essential hypertension   Primary Care at [place], Ines Bloomer, MD   1 year ago Tinea cruris   Primary Care at [place], Ines Bloomer, MD   2 years ago Essential hypertension   Primary Care at [facility], [place], [place]   2 years ago Annual physical exam   Primary Care at [place], [place]

## 2020-11-04 ENCOUNTER — Other Ambulatory Visit: Payer: Self-pay

## 2020-11-04 ENCOUNTER — Encounter: Payer: Self-pay | Admitting: Family Medicine

## 2020-11-04 ENCOUNTER — Ambulatory Visit (INDEPENDENT_AMBULATORY_CARE_PROVIDER_SITE_OTHER): Payer: Medicare Other | Admitting: Family Medicine

## 2020-11-04 VITALS — BP 130/80 | HR 56 | Temp 97.6°F | Ht 66.0 in | Wt 156.2 lb

## 2020-11-04 DIAGNOSIS — Z23 Encounter for immunization: Secondary | ICD-10-CM | POA: Diagnosis not present

## 2020-11-04 DIAGNOSIS — Z8619 Personal history of other infectious and parasitic diseases: Secondary | ICD-10-CM

## 2020-11-04 DIAGNOSIS — B029 Zoster without complications: Secondary | ICD-10-CM

## 2020-11-04 MED ORDER — VALACYCLOVIR HCL 1 G PO TABS: 1000.0000 mg | ORAL_TABLET | Freq: Two times a day (BID) | ORAL | 2 refills | Status: DC

## 2020-11-04 NOTE — Patient Instructions (Addendum)
   Take the valacyclovir 1 twice daily for 5 days should be sufficient. Keep some on hand and take it early whenever you have an outbreak.  Return as needed  If you have lab work done today you will be contacted with your lab results within the next 2 weeks.  If you have not heard from [place] then please contact us. The fastest way to get your results is to register for My Chart.   IF you received an x-ray today, you will receive an invoice from [facility]. Please contact [facility] at [phone] with questions or concerns regarding your invoice.   IF you received labwork today, you will receive an invoice from [facility]. Please contact [facility] at [phone] with questions or concerns regarding your invoice.   Our billing staff will not be able to assist you with questions regarding bills from these companies.  You will be contacted with the lab results as soon as they are available. The fastest way to get your results is to activate your My Chart account. Instructions are located on the last page of this paperwork. If you have not heard from [place] regarding the results in 2 weeks, please contact this office.

## 2020-11-04 NOTE — Progress Notes (Signed)
Patient ID: Daniel Lamb, male    DOB: 10-03-1951  Age: 69 y.o. MRN: [id number]  Chief Complaint  Patient presents with  . Medication Refill    Patient is here for a refill on his valacyclovir medication    Subjective:   69 year old male with a history of recurrent episodes of HSV rash. This time it is on the posterior thigh on the left. He knows what it feels like. It has been there for 2 days. He is out of his valacyclovir.  Current allergies, medications, problem list, past/family and social histories reviewed.  Objective:  BP 130/80 (BP Location: Right Arm, Patient Position: Sitting, Cuff Size: Normal)   Pulse (!) 56   Temp 97.6 F (36.4 C) (Temporal)   Ht 5\' 6"  (1.676 m)   Wt 156 lb 3.2 oz (70.9 kg)   SpO2 97%   BMI 25.21 kg/m   Small cluster of resolving vesicles about 2 cm x 1 cm on the posterior thigh  Assessment & Plan:   Assessment: 1. Need for prophylactic vaccination against Streptococcus pneumoniae (pneumococcus)   2. History of herpes simplex type 2 infection       Plan:  Return as needed Orders Placed This Encounter  Procedures  . Pneumococcal polysaccharide vaccine 23-valent greater than or equal to 2yo subcutaneous/IM    No orders of the defined types were placed in this encounter.        Patient Instructions       If you have lab work done today you will be contacted with your lab results within the next 2 weeks.  If you have not heard from [place] then please contact us. The fastest way to get your results is to register for My Chart.   IF you received an x-ray today, you will receive an invoice from [facility]. Please contact [facility] at [phone] with questions or concerns regarding your invoice.   IF you received labwork today, you will receive an invoice from [facility]. Please contact [facility] at [phone] with questions or concerns regarding your invoice.   Our billing staff will not be able to assist you  with questions regarding bills from these companies.  You will be contacted with the lab results as soon as they are available. The fastest way to get your results is to activate your My Chart account. Instructions are located on the last page of this paperwork. If you have not heard from [place] regarding the results in 2 weeks, please contact this office.         No follow-ups on file.   Ruben Reason, MD 11/04/2020

## 2020-11-08 DIAGNOSIS — Z23 Encounter for immunization: Secondary | ICD-10-CM | POA: Diagnosis not present

## 2020-11-18 ENCOUNTER — Telehealth: Payer: Self-pay | Admitting: *Deleted

## 2020-11-18 NOTE — Telephone Encounter (Signed)
Schedule AWV.  

## 2021-01-05 ENCOUNTER — Other Ambulatory Visit: Payer: Self-pay | Admitting: Emergency Medicine

## 2021-01-05 DIAGNOSIS — I10 Essential (primary) hypertension: Secondary | ICD-10-CM

## 2021-01-05 NOTE — Telephone Encounter (Signed)
Pt needs appointment & lab work - courtesy refill. Requested Prescriptions  Pending Prescriptions Disp Refills  . lisinopril (ZESTRIL) 20 MG tablet [Pharmacy Med Name: LISINOPRIL 20 MG TABLET] 30 tablet 0    Sig: TAKE 1 TABLET BY MOUTH EVERY DAY     Cardiovascular:  ACE Inhibitors Failed - 01/05/2021  1:55 AM      Failed - Cr in normal range and within 180 days    Creat  Date Value Ref Range Status  06/28/2015 0.98 0.70 - 1.25 mg/dL Final   Creatinine, Ser  Date Value Ref Range Status  07/09/2018 0.98 0.76 - 1.27 mg/dL Final         Failed - K in normal range and within 180 days    Potassium  Date Value Ref Range Status  07/09/2018 4.4 3.5 - 5.2 mmol/L Final         Passed - Patient is not pregnant      Passed - Last BP in normal range    BP Readings from Last 1 Encounters:  11/04/20 130/80         Passed - Valid encounter within last 6 months    Recent Outpatient Visits          2 months ago History of herpes simplex type 2 infection   Primary Care at [facility], Fenton Malling, MD   6 months ago Viral respiratory infection   Primary Care at [facility], [place]   1 year ago Essential hypertension   Primary Care at [place], Ines Bloomer, MD   1 year ago Tinea cruris   Primary Care at [place], Ines Bloomer, MD   2 years ago Essential hypertension   Primary Care at [place], [place]

## 2021-01-30 ENCOUNTER — Other Ambulatory Visit: Payer: Self-pay | Admitting: Emergency Medicine

## 2021-01-30 DIAGNOSIS — I10 Essential (primary) hypertension: Secondary | ICD-10-CM

## 2021-01-30 NOTE — Telephone Encounter (Signed)
Requested medication (s) are due for refill today: yes  Requested medication (s) are on the active medication list: yes  Last refill:  01/05/21  Future visit scheduled: no  Notes to clinic:  needs appt- courtesy RF previously given   Requested Prescriptions  Pending Prescriptions Disp Refills   lisinopril (ZESTRIL) 20 MG tablet [Pharmacy Med Name: LISINOPRIL 20 MG TABLET] 30 tablet 0    Sig: TAKE 1 TABLET BY MOUTH EVERY DAY      Cardiovascular:  ACE Inhibitors Failed - 01/30/2021  1:32 PM      Failed - Cr in normal range and within 180 days    Creat  Date Value Ref Range Status  06/28/2015 0.98 0.70 - 1.25 mg/dL Final   Creatinine, Ser  Date Value Ref Range Status  07/09/2018 0.98 0.76 - 1.27 mg/dL Final          Failed - K in normal range and within 180 days    Potassium  Date Value Ref Range Status  07/09/2018 4.4 3.5 - 5.2 mmol/L Final          Passed - Patient is not pregnant      Passed - Last BP in normal range    BP Readings from Last 1 Encounters:  11/04/20 130/80          Passed - Valid encounter within last 6 months    Recent Outpatient Visits           2 months ago History of herpes simplex type 2 infection   Primary Care at [facility], Fenton Malling, MD   6 months ago Viral respiratory infection   Primary Care at [facility], Ines Bloomer, MD   1 year ago Essential hypertension   Primary Care at [place], Ines Bloomer, MD   2 years ago Tinea cruris   Primary Care at [place], Ines Bloomer, MD   2 years ago Essential hypertension   Primary Care at [place], [place]

## 2021-01-31 NOTE — Telephone Encounter (Signed)
Called patient to schedule him an appointment for medication refill and left a voicemail. Sent Lisinopril to the pharmacy because patient was in the office 11/04/2020 and blood pressure was 130/80.

## 2021-02-24 ENCOUNTER — Other Ambulatory Visit: Payer: Self-pay | Admitting: Emergency Medicine

## 2021-02-24 DIAGNOSIS — I10 Essential (primary) hypertension: Secondary | ICD-10-CM

## 2021-02-24 NOTE — Telephone Encounter (Signed)
Requested Prescriptions  Pending Prescriptions Disp Refills  . lisinopril (ZESTRIL) 20 MG tablet [Pharmacy Med Name: LISINOPRIL 20 MG TABLET] 30 tablet 0    Sig: TAKE 1 TABLET BY MOUTH EVERY DAY     Cardiovascular:  ACE Inhibitors Failed - 02/24/2021 12:36 PM      Failed - Cr in normal range and within 180 days    Creat  Date Value Ref Range Status  06/28/2015 0.98 0.70 - 1.25 mg/dL Final   Creatinine, Ser  Date Value Ref Range Status  07/09/2018 0.98 0.76 - 1.27 mg/dL Final         Failed - K in normal range and within 180 days    Potassium  Date Value Ref Range Status  07/09/2018 4.4 3.5 - 5.2 mmol/L Final         Passed - Patient is not pregnant      Passed - Last BP in normal range    BP Readings from Last 1 Encounters:  11/04/20 130/80         Passed - Valid encounter within last 6 months    Recent Outpatient Visits          3 months ago History of herpes simplex type 2 infection   Primary Care at [facility], Fenton Malling, MD   7 months ago Viral respiratory infection   Primary Care at [facility], Ines Bloomer, MD   1 year ago Essential hypertension   Primary Care at [place], Ines Bloomer, MD   2 years ago Tinea cruris   Primary Care at [place], Ines Bloomer, MD   2 years ago Essential hypertension   Primary Care at [place], [place]

## 2021-03-30 ENCOUNTER — Other Ambulatory Visit: Payer: Self-pay

## 2021-03-30 ENCOUNTER — Encounter: Payer: Self-pay | Admitting: Emergency Medicine

## 2021-03-30 ENCOUNTER — Ambulatory Visit (INDEPENDENT_AMBULATORY_CARE_PROVIDER_SITE_OTHER): Payer: Medicare Other | Admitting: Emergency Medicine

## 2021-03-30 VITALS — BP 138/80 | HR 57 | Temp 98.2°F | Ht 66.0 in | Wt 157.6 lb

## 2021-03-30 DIAGNOSIS — M25541 Pain in joints of right hand: Secondary | ICD-10-CM

## 2021-03-30 DIAGNOSIS — M25542 Pain in joints of left hand: Secondary | ICD-10-CM

## 2021-03-30 DIAGNOSIS — Z1211 Encounter for screening for malignant neoplasm of colon: Secondary | ICD-10-CM | POA: Diagnosis not present

## 2021-03-30 DIAGNOSIS — B356 Tinea cruris: Secondary | ICD-10-CM

## 2021-03-30 DIAGNOSIS — N529 Male erectile dysfunction, unspecified: Secondary | ICD-10-CM

## 2021-03-30 DIAGNOSIS — I10 Essential (primary) hypertension: Secondary | ICD-10-CM

## 2021-03-30 DIAGNOSIS — M19041 Primary osteoarthritis, right hand: Secondary | ICD-10-CM | POA: Insufficient documentation

## 2021-03-30 MED ORDER — SILDENAFIL CITRATE 20 MG PO TABS: ORAL_TABLET | ORAL | 11 refills | Status: DC

## 2021-03-30 MED ORDER — CLOTRIMAZOLE-BETAMETHASONE 1-0.05 % EX CREA: 1.0000 "application " | TOPICAL_CREAM | Freq: Two times a day (BID) | CUTANEOUS | 2 refills | Status: DC

## 2021-03-30 NOTE — Assessment & Plan Note (Addendum)
Well-controlled hypertension.  Continue lisinopril 20 mg daily. Diet and nutrition discussed.  Advised to monitor blood pressure readings at home at least 3 times per week. Follow-up in 6 months.

## 2021-03-30 NOTE — Progress Notes (Addendum)
Daniel Lamb 70 y.o.   Chief Complaint  Patient presents with  . Joint Pain    Per patient in the hands and fingers for one months  . Medication Refill    Sildenafil and Lotrisone    HISTORY OF PRESENT ILLNESS: This is a 70 y.o. male complaining of occasional joint pains to both hands and fingers for the past couple months. Works as a Community education officer.  Himself physically active with regular exercise.  Good nutrition. Requesting refills on Lotrisone and sildenafil. Has history of hypertension on lisinopril 20 mg daily. Needs referral for colonoscopy. No other complaints or medical concerns today.  HPI   Prior to Admission medications   Medication Sig Start Date End Date Taking? Authorizing Provider  clotrimazole-betamethasone (LOTRISONE) cream Apply 1 application topically 2 (two) times daily. 01/22/19  Yes Jamiaya Bina, Ines Bloomer, MD  lisinopril (ZESTRIL) 20 MG tablet TAKE 1 TABLET BY MOUTH EVERY DAY 02/24/21  Yes Kinzlie Harney, Ines Bloomer, MD  sildenafil (REVATIO) 20 MG tablet TAKE 1-2 TABLETS BY MOUTH DAILY AS NEEDED 1 HR PRIOR TO SEXUAL ACTIVITY FOR MAXIMAL EFFECTIVENESS. 12/12/19  Yes Florice Hindle, Ines Bloomer, MD  valACYclovir (VALTREX) 1000 MG tablet Take 1 tablet (1,000 mg total) by mouth 2 (two) times daily. 11/04/20  Yes Posey Boyer, MD    No Known Allergies  Patient Active Problem List   Diagnosis Date Noted  . Essential hypertension 07/09/2018  . Erectile dysfunction 07/09/2018  . History of herpes genitalis 06/28/2015    Past Medical History:  Diagnosis Date  . Genital herpes   . Hypertension     Past Surgical History:  Procedure Laterality Date  . VASECTOMY      Social History   Socioeconomic History  . Marital status: Married    Spouse name: Not on file  . Number of children: Not on file  . Years of education: Not on file  . Highest education level: Not on file  Occupational History  . Not on file  Tobacco Use  . Smoking status: Never Smoker  .  Smokeless tobacco: Never Used  Substance and Sexual Activity  . Alcohol use: Yes  . Drug use: No  . Sexual activity: Not on file  Other Topics Concern  . Not on file  Social History Narrative  . Not on file   Social Determinants of Health   Financial Resource Strain: Not on file  Food Insecurity: Not on file  Transportation Needs: Not on file  Physical Activity: Not on file  Stress: Not on file  Social Connections: Not on file  Intimate Partner Violence: Not on file    Family History  Problem Relation Age of Onset  . Cancer Mother   . Cancer Father      Review of Systems  Constitutional: Negative.  Negative for chills and fever.  HENT: Negative.  Negative for congestion and sore throat.   Respiratory: Negative.  Negative for cough and shortness of breath.   Cardiovascular: Negative.  Negative for chest pain and palpitations.  Gastrointestinal: Negative for abdominal pain, blood in stool, diarrhea, melena, nausea and vomiting.  Genitourinary: Negative.  Negative for dysuria and hematuria.  Musculoskeletal: Positive for joint pain.  Skin: Negative.  Negative for rash.  Neurological: Negative.  Negative for dizziness and headaches.  All other systems reviewed and are negative.  Today's Vitals   03/30/21 0848  BP: 138/80  Pulse: (!) 57  Temp: 98.2 F (36.8 C)  TempSrc: Oral  SpO2: 98%  Weight: 157  lb 9.6 oz (71.5 kg)  Height: 5\' 6"  (1.676 m)   Body mass index is 25.44 kg/m.   Physical Exam Vitals reviewed.  Constitutional:      Appearance: Normal appearance.  HENT:     Head: Normocephalic and atraumatic.  Eyes:     Extraocular Movements: Extraocular movements intact.     Conjunctiva/sclera: Conjunctivae normal.     Pupils: Pupils are equal, round, and reactive to light.  Cardiovascular:     Rate and Rhythm: Normal rate and regular rhythm.     Pulses: Normal pulses.     Heart sounds: Normal heart sounds.  Pulmonary:     Effort: Pulmonary effort is  normal.     Breath sounds: Normal breath sounds.  Musculoskeletal:     Cervical back: Normal range of motion and neck supple.     Right lower leg: No edema.     Left lower leg: No edema.     Comments: Osteoarthritis changes to several fingers of both hands Full range of motion of all fingers.  Has history of trigger finger both middle fingers  Skin:    General: Skin is warm.     Capillary Refill: Capillary refill takes less than 2 seconds.  Neurological:     General: No focal deficit present.     Mental Status: He is alert and oriented to person, place, and time.  Psychiatric:        Mood and Affect: Mood normal.        Behavior: Behavior normal.      ASSESSMENT & PLAN: A total of 30 minutes was spent with the patient and counseling/coordination of care regarding hypertension and cardiovascular risks associated with this condition, review of all medications, osteoarthritis and treatment, recommend rheumatology evaluation, proper and safe use of occasional NSAIDs with hypertension precautions, education on nutrition, health maintenance items including colon cancer screening with colonoscopy and referral, review of most recent office visit notes, review of most recent blood work results, prognosis, documentation and need for follow-up.  Arthralgia of both hands Osteoarthritis most likely.  Advised to take NSAIDs as needed. Rheumatology referral placed today.  Erectile dysfunction Sildenafil working well.  Refill provided.  Essential hypertension Well-controlled hypertension.  Continue lisinopril 20 mg daily. Diet and nutrition discussed.  Advised to monitor blood pressure readings at home at least 3 times per week. Follow-up in 6 months.   Vinayak was seen today for joint pain and medication refill.  Diagnoses and all orders for this visit:  Arthralgia of both hands -     Ambulatory referral to Rheumatology  Tinea cruris -     clotrimazole-betamethasone (LOTRISONE) cream;  Apply 1 application topically 2 (two) times daily.  Colon cancer screening -     Ambulatory referral to Gastroenterology  Erectile dysfunction, unspecified erectile dysfunction type -     sildenafil (REVATIO) 20 MG tablet; TAKE 1-2 TABLETS BY MOUTH DAILY AS NEEDED 1 HR PRIOR TO SEXUAL ACTIVITY FOR MAXIMAL EFFECTIVENESS.  Essential hypertension    Patient Instructions   Health Maintenance After Age 69 After age 83, you are at a higher risk for certain long-term diseases and infections as well as injuries from falls. Falls are a major cause of broken bones and head injuries in people who are older than age 86. Getting regular preventive care can help to keep you healthy and well. Preventive care includes getting regular testing and making lifestyle changes as recommended by your health care provider. Talk with your health care provider  about:  Which screenings and tests you should have. A screening is a test that checks for a disease when you have no symptoms.  A diet and exercise plan that is right for you. What should I know about screenings and tests to prevent falls? Screening and testing are the best ways to find a health problem early. Early diagnosis and treatment give you the best chance of managing medical conditions that are common after age 33. Certain conditions and lifestyle choices may make you more likely to have a fall. Your health care provider may recommend:  Regular vision checks. Poor vision and conditions such as cataracts can make you more likely to have a fall. If you wear glasses, make sure to get your prescription updated if your vision changes.  Medicine review. Work with your health care provider to regularly review all of the medicines you are taking, including over-the-counter medicines. Ask your health care provider about any side effects that may make you more likely to have a fall. Tell your health care provider if any medicines that you take make you feel dizzy  or sleepy.  Osteoporosis screening. Osteoporosis is a condition that causes the bones to get weaker. This can make the bones weak and cause them to break more easily.  Blood pressure screening. Blood pressure changes and medicines to control blood pressure can make you feel dizzy.  Strength and balance checks. Your health care provider may recommend certain tests to check your strength and balance while standing, walking, or changing positions.  Foot health exam. Foot pain and numbness, as well as not wearing proper footwear, can make you more likely to have a fall.  Depression screening. You may be more likely to have a fall if you have a fear of falling, feel emotionally low, or feel unable to do activities that you used to do.  Alcohol use screening. Using too much alcohol can affect your balance and may make you more likely to have a fall. What actions can I take to lower my risk of falls? General instructions  Talk with your health care provider about your risks for falling. Tell your health care provider if: ? You fall. Be sure to tell your health care provider about all falls, even ones that seem minor. ? You feel dizzy, sleepy, or off-balance.  Take over-the-counter and prescription medicines only as told by your health care provider. These include any supplements.  Eat a healthy diet and maintain a healthy weight. A healthy diet includes low-fat dairy products, low-fat (lean) meats, and fiber from whole grains, beans, and lots of fruits and vegetables. Home safety  Remove any tripping hazards, such as rugs, cords, and clutter.  Install safety equipment such as grab bars in bathrooms and safety rails on stairs.  Keep rooms and walkways well-lit. Activity  Follow a regular exercise program to stay fit. This will help you maintain your balance. Ask your health care provider what types of exercise are appropriate for you.  If you need a cane or walker, use it as recommended by  your health care provider.  Wear supportive shoes that have nonskid soles.   Lifestyle  Do not drink alcohol if your health care provider tells you not to drink.  If you drink alcohol, limit how much you have: ? 0-1 drink a day for women. ? 0-2 drinks a day for men.  Be aware of how much alcohol is in your drink. In the [place] one drink equals one typical bottle  of beer (12 oz), one-half glass of wine (5 oz), or one shot of hard liquor (1 oz).  Do not use any products that contain nicotine or tobacco, such as cigarettes and e-cigarettes. If you need help quitting, ask your health care provider. Summary  Having a healthy lifestyle and getting preventive care can help to protect your health and wellness after age 31.  Screening and testing are the best way to find a health problem early and help you avoid having a fall. Early diagnosis and treatment give you the best chance for managing medical conditions that are more common for people who are older than age 14.  Falls are a major cause of broken bones and head injuries in people who are older than age 10. Take precautions to prevent a fall at home.  Work with your health care provider to learn what changes you can make to improve your health and wellness and to prevent falls. This information is not intended to replace advice given to you by your health care provider. Make sure you discuss any questions you have with your health care provider. Document Revised: 03/13/2019 Document Reviewed: 10/03/2017 Elsevier Patient Education  2021 [place], MD [facility]

## 2021-03-30 NOTE — Patient Instructions (Signed)
Health Maintenance After Age 70 After age 70, you are at a higher risk for certain long-term diseases and infections as well as injuries from falls. Falls are a major cause of broken bones and head injuries in people who are older than age 70. Getting regular preventive care can help to keep you healthy and well. Preventive care includes getting regular testing and making lifestyle changes as recommended by your health care provider. Talk with your health care provider about:  Which screenings and tests you should have. A screening is a test that checks for a disease when you have no symptoms.  A diet and exercise plan that is right for you. What should I know about screenings and tests to prevent falls? Screening and testing are the best ways to find a health problem early. Early diagnosis and treatment give you the best chance of managing medical conditions that are common after age 70. Certain conditions and lifestyle choices may make you more likely to have a fall. Your health care provider may recommend:  Regular vision checks. Poor vision and conditions such as cataracts can make you more likely to have a fall. If you wear glasses, make sure to get your prescription updated if your vision changes.  Medicine review. Work with your health care provider to regularly review all of the medicines you are taking, including over-the-counter medicines. Ask your health care provider about any side effects that may make you more likely to have a fall. Tell your health care provider if any medicines that you take make you feel dizzy or sleepy.  Osteoporosis screening. Osteoporosis is a condition that causes the bones to get weaker. This can make the bones weak and cause them to break more easily.  Blood pressure screening. Blood pressure changes and medicines to control blood pressure can make you feel dizzy.  Strength and balance checks. Your health care provider may recommend certain tests to check your  strength and balance while standing, walking, or changing positions.  Foot health exam. Foot pain and numbness, as well as not wearing proper footwear, can make you more likely to have a fall.  Depression screening. You may be more likely to have a fall if you have a fear of falling, feel emotionally low, or feel unable to do activities that you used to do.  Alcohol use screening. Using too much alcohol can affect your balance and may make you more likely to have a fall. What actions can I take to lower my risk of falls? General instructions  Talk with your health care provider about your risks for falling. Tell your health care provider if: ? You fall. Be sure to tell your health care provider about all falls, even ones that seem minor. ? You feel dizzy, sleepy, or off-balance.  Take over-the-counter and prescription medicines only as told by your health care provider. These include any supplements.  Eat a healthy diet and maintain a healthy weight. A healthy diet includes low-fat dairy products, low-fat (lean) meats, and fiber from whole grains, beans, and lots of fruits and vegetables. Home safety  Remove any tripping hazards, such as rugs, cords, and clutter.  Install safety equipment such as grab bars in bathrooms and safety rails on stairs.  Keep rooms and walkways well-lit. Activity  Follow a regular exercise program to stay fit. This will help you maintain your balance. Ask your health care provider what types of exercise are appropriate for you.  If you need a cane or walker,   use it as recommended by your health care provider.  Wear supportive shoes that have nonskid soles.   Lifestyle  Do not drink alcohol if your health care provider tells you not to drink.  If you drink alcohol, limit how much you have: ? 0-1 drink a day for women. ? 0-2 drinks a day for men.  Be aware of how much alcohol is in your drink. In the [place] one drink equals one typical bottle of beer (12  oz), one-half glass of wine (5 oz), or one shot of hard liquor (1 oz).  Do not use any products that contain nicotine or tobacco, such as cigarettes and e-cigarettes. If you need help quitting, ask your health care provider. Summary  Having a healthy lifestyle and getting preventive care can help to protect your health and wellness after age 70.  Screening and testing are the best way to find a health problem early and help you avoid having a fall. Early diagnosis and treatment give you the best chance for managing medical conditions that are more common for people who are older than age 70.  Falls are a major cause of broken bones and head injuries in people who are older than age 70. Take precautions to prevent a fall at home.  Work with your health care provider to learn what changes you can make to improve your health and wellness and to prevent falls. This information is not intended to replace advice given to you by your health care provider. Make sure you discuss any questions you have with your health care provider. Document Revised: 03/13/2019 Document Reviewed: 10/03/2017 Elsevier Patient Education  2021 [employer]vier Inc.  

## 2021-03-30 NOTE — Assessment & Plan Note (Signed)
Sildenafil working well.  Refill provided.

## 2021-03-30 NOTE — Assessment & Plan Note (Signed)
Osteoarthritis most likely.  Advised to take NSAIDs as needed. Rheumatology referral placed today.

## 2021-05-17 NOTE — Progress Notes (Signed)
Office Visit Note  Patient: Daniel Lamb             Date of Birth: 12-05-1950           MRN: [id number]             PCP: Horald Pollen, MD Referring: Horald Pollen, * Visit Date: 05/18/2021 Occupation: Physical therapy assistant  Subjective:  New Patient (Initial Visit) (Patient complains of bilateral hand pain, stiffness, and occasional swelling (left>right, patient is right hand dominant). RIF and LMF are particularly bad. )   History of Present Illness: Daniel Lamb is a 70 y.o. male here for evaluation of bilateral hand pain. His hand pain is fairly new onset since around 3 months ago this was pretty acute onset and is partially better over time now today as compared to initial symptoms. He feels a lot of stiffness first thing in the morning this partially improves but remains to some extent throughout the day. He notices swelling sometimes. The most involved areas are his left middle finger and right index finger. He never notices redness or heat in affected areas. The base of his left thumb also sometimes has pain with radiation down the finger that seems to be provoked with use. He took tylenol sometimes as needed but no medicine regularly and no prescription medicine for this. He has chronic right knee pain with some known osteoarthritis. He has previous history of trigger finger on multiple right hand digits years ago that improved on its own.  Activities of Daily Living:  Patient reports morning stiffness for 24 hours.   Patient Denies nocturnal pain.  Difficulty dressing/grooming: Reports Difficulty climbing stairs: Denies Difficulty getting out of chair: Denies Difficulty using hands for taps, buttons, cutlery, and/or writing: Reports  Review of Systems  Constitutional:  Negative for fatigue.  HENT:  Negative for mouth sores, mouth dryness and nose dryness.   Eyes:  Negative for pain, itching, visual disturbance and dryness.  Respiratory:  Negative  for cough, hemoptysis, shortness of breath and difficulty breathing.   Cardiovascular:  Negative for chest pain, palpitations and swelling in legs/feet.  Gastrointestinal:  Negative for abdominal pain, blood in stool, constipation and diarrhea.  Endocrine: Negative for increased urination.  Genitourinary:  Negative for painful urination.  Musculoskeletal:  Positive for joint pain, joint pain, joint swelling and morning stiffness. Negative for myalgias, muscle weakness, muscle tenderness and myalgias.  Skin:  Negative for color change, rash and redness.  Allergic/Immunologic: Negative for susceptible to infections.  Neurological:  Negative for dizziness, numbness, headaches, memory loss and weakness.  Hematological:  Negative for swollen glands.  Psychiatric/Behavioral:  Positive for sleep disturbance. Negative for confusion.    PMFS History:  Patient Active Problem List   Diagnosis Date Noted   Dupuytren's contracture of left hand 05/18/2021   Osteoarthritis of both hands 03/30/2021   Essential hypertension 07/09/2018   Erectile dysfunction 07/09/2018   History of herpes genitalis 06/28/2015    Past Medical History:  Diagnosis Date   Genital herpes    Hypertension     Family History  Problem Relation Age of Onset   Cancer Mother    Cancer Father    COPD Brother    Lung cancer Brother    Past Surgical History:  Procedure Laterality Date   VASECTOMY     Social History   Social History Narrative   Not on file   Immunization History  Administered Date(s) Administered   Influenza, High Dose Seasonal  PF 08/30/2019   Influenza-Unspecified 09/03/2018   Moderna Sars-Covid-2 Vaccination 12/17/2019, 01/14/2020   PFIZER(Purple Top)SARS-COV-2 Vaccination 11/08/2020   PPD Test 02/02/2012, 02/11/2012   Pneumococcal Conjugate-13 07/09/2018   Pneumococcal Polysaccharide-23 11/04/2020   Tdap 08/30/2019   Zoster Recombinat (Shingrix) 09/10/2017     Objective: Vital Signs: BP  122/75 (BP Location: Right Arm, Patient Position: Sitting, Cuff Size: Normal)   Pulse (!) 52   Ht 5\' 5"  (1.651 m)   Wt 153 lb 6.4 oz (69.6 kg)   BMI 25.53 kg/m    Physical Exam HENT:     Right Ear: External ear normal.     Left Ear: External ear normal.  Eyes:     Conjunctiva/sclera: Conjunctivae normal.  Skin:    General: Skin is warm and dry.     Findings: No rash.     Comments: Longitudinal fingernail ridges without discoloration  Neurological:     General: No focal deficit present.     Mental Status: He is alert.  Psychiatric:        Mood and Affect: Mood normal.     Musculoskeletal Exam:  Shoulders full ROM no tenderness or swelling Elbows full ROM no tenderness or swelling Wrists full ROM no tenderness or swelling Fingers right 2nd PIP tenderness and join enlargement, left 3rd PIP tenderness, inability to fully extend, early soft tissue contracture on volar surface, left 5th DIP flexed deformity Knees full ROM no tenderness or swelling Ankles full ROM no tenderness or swelling   Investigation: No additional findings.  Imaging: XR Hand 2 View Left  Result Date: 05/18/2021 X-ray left hand 2 views Radiocarpal joint space appears normal.  First CMC joint mild degenerative arthritis changes with slight joint subluxation, more severe at the first MCP.  Soft tissue calcification is seen likely at first dorsal compartment.  MCP joints appear normal.  PIP joint spaces appear intact very slight asymmetric narrowing.  DIP joints more advanced degenerative arthritis especially at third DIP and fifth DIP with flexion deformity.  No joint erosions are seen.  Bone mineralization appears normal. Impression Mild to moderate degenerative arthritis changes seen at first [facility] and MCP and at DIP joints looks most consistent with primary osteoarthritis, question chronic tendinopathy at base of thumb due to soft tissue calcification.  XR Hand 2 View Right  Result Date: 05/18/2021 X-ray right  hand 2 views Radiocarpal carpal joint spaces appear normal.  Possible second MCP joint space narrowing otherwise rest appear normal.  Some asymmetric joint space changes at second and fifth PIPs with ulnar deviation and second digit.  DIP joints appear preserved except some lateral osteophytes and narrowing of the fifth DIP.  No erosions are seen and bone mineralization appears normal. Impression Mild osteoarthritic changes primarily at PIP and DIP joints with no specific inflammatory changes seen   Recent Labs: Lab Results  Component Value Date   WBC 5.6 07/09/2018   HGB 15.1 07/09/2018   PLT 205 07/09/2018   NA 141 07/09/2018   K 4.4 07/09/2018   CL 101 07/09/2018   CO2 24 07/09/2018   GLUCOSE 135 (H) 07/09/2018   BUN 13 07/09/2018   CREATININE 0.98 07/09/2018   BILITOT 0.7 07/09/2018   ALKPHOS 46 07/09/2018   AST 30 07/09/2018   ALT 20 07/09/2018   PROT 7.6 07/09/2018   ALBUMIN 4.7 07/09/2018   CALCIUM 9.8 07/09/2018   GFRAA 92 07/09/2018    Speciality Comments: No specialty comments available.  Procedures:  No procedures performed Allergies: Patient has  no known allergies.   Assessment / Plan:     Visit Diagnoses: Arthralgia of both hands  Primary osteoarthritis of both hands - Plan: XR Hand 2 View Right, XR Hand 2 View Left  Bilateral hand pain with a marked and abrupt increase since about 3 months ago there is some component of morning stiffness and occasionally joint swelling but also has persistent pain throughout the day does not describe very pronounced inflammatory changes.  There are some tender joints on exam although ultrasound exam is negative for effusions or color Doppler enhancement of synovial tissue.  X-rays obtained of bilateral hands demonstrate osteoarthritic changes with no inflammatory osseous changes.  I discussed current findings were not suggestive for rheumatoid arthritis, CPPD, gout, or psoriatic disease so do not recommend extensive lab work-up for  immunosuppressive treatment.  Discussed osteoarthritis management recommended could use naproxen twice daily as needed, taken with food when symptoms are bothersome.  Dupuytren's contracture of left hand  Early contracture formation on left palm at third digit.  This could be contributing to the decreased extension range of motion but there is also significant DIP arthritis.  Recommended if this is becoming more bothersome or more reduced range of movement he can let us know since is often amenable to injection treatment.  Orders: Orders Placed This Encounter  Procedures   XR Hand 2 View Right   XR Hand 2 View Left    No orders of the defined types were placed in this encounter.    Follow-Up Instructions: Return if symptoms worsen or fail to improve.   Collier Salina, MD  Note - This record has been created using [employer].  Chart creation errors have been sought, but may not always  have been located. Such creation errors do not reflect on  the standard of medical care.

## 2021-05-18 ENCOUNTER — Encounter: Payer: Self-pay | Admitting: Internal Medicine

## 2021-05-18 ENCOUNTER — Ambulatory Visit: Payer: Self-pay

## 2021-05-18 ENCOUNTER — Other Ambulatory Visit: Payer: Self-pay

## 2021-05-18 ENCOUNTER — Ambulatory Visit (INDEPENDENT_AMBULATORY_CARE_PROVIDER_SITE_OTHER): Payer: Medicare Other | Admitting: Internal Medicine

## 2021-05-18 VITALS — BP 122/75 | HR 52 | Ht 65.0 in | Wt 153.4 lb

## 2021-05-18 DIAGNOSIS — M25542 Pain in joints of left hand: Secondary | ICD-10-CM

## 2021-05-18 DIAGNOSIS — M720 Palmar fascial fibromatosis [Dupuytren]: Secondary | ICD-10-CM | POA: Diagnosis not present

## 2021-05-18 DIAGNOSIS — M19042 Primary osteoarthritis, left hand: Secondary | ICD-10-CM

## 2021-05-18 DIAGNOSIS — M25541 Pain in joints of right hand: Secondary | ICD-10-CM | POA: Diagnosis not present

## 2021-05-18 DIAGNOSIS — M19041 Primary osteoarthritis, right hand: Secondary | ICD-10-CM

## 2021-05-18 NOTE — Patient Instructions (Signed)
Dupuytren's Contracture Dupuytren's contracture is a condition in which tissue under the skin of the palm becomes thick. This causes one or more of the fingers to curl inward (contract) toward the palm. After a while, the fingers may not be able to straighten out. This condition affects some or all of the fingers and the palm of thehand. This condition may affect one or both hands. Dupuytren's contracture is a long-term (chronic) condition that develops (progresses) slowly over time. There is no cure, but symptoms can be managed and progression can be slowed with treatment. This condition is usually notdangerous or painful, but it can interfere with everyday tasks. What are the causes?  This condition is caused by tissue (fascia) in the palm that gets thicker and tighter. When the fascia thickens, it pulls on the cords of tissue (tendons) that control finger movement. This causes the fingers to contract. The cause of fascia thickening is not known. However, the condition is often passed along from parent to child (inherited). What increases the risk? The following factors may make you more likely to develop this condition: Being 55 years of age or older. Being male. Having a family history of this condition. Using tobacco products, including cigarettes, chewing tobacco, and e-cigarettes. Drinking alcohol excessively. Having diabetes. Having a seizure disorder. What are the signs or symptoms? Early symptoms of this condition may include: Thick, puckered skin on the hand. One or more lumps (nodules) on the palm. Nodules may be tender when they first appear, but they are generally painless. Later symptoms of this condition may include: Thick cords of tissue in the palm. Fingers curled up toward the palm. Inability to straighten the fingers into their normal position. Though this condition is usually painless, you may have discomfort when holdingor grabbing objects. How is this  diagnosed? This condition is diagnosed with a physical exam, which may include: Looking at your hands and feeling your palms. This is to check for thickened fascia and nodules. Measuring finger motion. Doing the [place] tabletop test. You may be asked to try to put your hand on a surface, with your palm down and your fingers straight out. How is this treated? There is no cure for this condition, but treatment can relieve discomfort and make symptoms more manageable. Treatment options may include: Physical therapy. This can strengthen your hand and increase flexibility. Occupational therapy. This can help you with everyday tasks that may be more difficult because of your condition. Shots (injections). Substances may be injected into your hand, such as: Medicines that help to decrease swelling (corticosteroids). Proteins (collagenase) to weaken thick tissue. After a collagenase injection, your health care provider may stretch your fingers. Needle aponeurotomy. A needle is pushed through the skin and into the fascia. Moving the needle against the fascia can weaken or break up the thick tissue. Surgery. This may be needed if your condition causes discomfort or interferes with everyday activities. Physical therapy is usually needed after surgery. No treatment is guaranteed to cure this condition. Recurrence of symptoms iscommon. Follow these instructions at home: Hand care Take these actions to help protect your hand from possible injury: Use tools that have padded grips. Wear protective gloves while you work with your hands. Avoid repetitive hand movements. General instructions Take over-the-counter and prescription medicines only as told by your health care provider. Manage any other conditions that you have, such as diabetes. If physical therapy was prescribed, do exercises as told by your health care provider. Do not use any products that  contain nicotine or tobacco, such as cigarettes,  e-cigarettes, and chewing tobacco. If you need help quitting, ask your health care provider. If you drink alcohol: Limit how much you use to: 0-1 drink a day for women. 0-2 drinks a day for men. Be aware of how much alcohol is in your drink. In the [place] one drink equals one 12 oz bottle of beer (355 mL), one 5 oz glass of wine (148 mL), or one 1 oz glass of hard liquor (44 mL). Keep all follow-up visits as told by your health care provider. This is important. Contact a health care provider if: You develop new symptoms, or your symptoms get worse. You have pain that gets worse or does not get better with medicine. You have difficulty or discomfort with everyday tasks. You develop numbness or tingling. Get help right away if: You have severe pain. Your fingers change color or become unusually cold. Summary Dupuytren's contracture is a condition in which tissue under the skin of the palm becomes thick. This condition is caused by tissue (fascia) that thickens. When it thickens, it pulls on the cords of tissue (tendons) that control finger movement and makes the fingers to contract. You are more likely to develop this condition if you are a man, are over 6 years of age, have a family history of the condition, and drink a lot of alcohol. This condition can be treated with physical and occupational therapy, injections, and surgery. Follow instructions about how to care for your hand. Get help right away if you have severe pain or your fingers change color or become cold. This information is not intended to replace advice given to you by your health care provider. Make sure you discuss any questions you have with your healthcare provider. Document Revised: 06/11/2018 Document Reviewed: 06/11/2018 Elsevier Patient Education  [place].

## 2021-05-24 ENCOUNTER — Other Ambulatory Visit: Payer: Self-pay | Admitting: Emergency Medicine

## 2021-05-24 DIAGNOSIS — I10 Essential (primary) hypertension: Secondary | ICD-10-CM

## 2021-06-27 ENCOUNTER — Telehealth: Payer: Self-pay

## 2021-06-27 NOTE — Telephone Encounter (Signed)
pt has stated he has tested POS for COVID on Saturday and is wanting the anti-viral medication sent in to help him with his sxs of cough and running nose, body aches and fatigue.  *Sent to [facility].

## 2021-06-28 NOTE — Telephone Encounter (Signed)
Patient calling back to see if medication was sent. Meds were not sent due to his provider being out it was however forward to another provider whom suggested the patient first be seen virtually for further evaluations. Patient then aggressively declined the virtual. Patient understood medication wouldn't be sent.

## 2021-06-28 NOTE — Telephone Encounter (Signed)
Ls sch VOV w/any provider today Thx

## 2021-08-21 ENCOUNTER — Other Ambulatory Visit: Payer: Self-pay | Admitting: Emergency Medicine

## 2021-08-21 DIAGNOSIS — I10 Essential (primary) hypertension: Secondary | ICD-10-CM

## 2021-11-26 ENCOUNTER — Other Ambulatory Visit: Payer: Self-pay | Admitting: Emergency Medicine

## 2021-11-26 DIAGNOSIS — I10 Essential (primary) hypertension: Secondary | ICD-10-CM

## 2022-01-13 ENCOUNTER — Ambulatory Visit: Payer: Medicare Other | Admitting: Internal Medicine

## 2022-02-14 ENCOUNTER — Encounter: Payer: Self-pay | Admitting: Emergency Medicine

## 2022-02-14 ENCOUNTER — Ambulatory Visit (INDEPENDENT_AMBULATORY_CARE_PROVIDER_SITE_OTHER): Payer: Medicare Other | Admitting: Emergency Medicine

## 2022-02-14 ENCOUNTER — Other Ambulatory Visit: Payer: Self-pay

## 2022-02-14 ENCOUNTER — Ambulatory Visit (INDEPENDENT_AMBULATORY_CARE_PROVIDER_SITE_OTHER): Payer: Medicare Other

## 2022-02-14 VITALS — BP 130/70 | HR 69 | Ht 65.0 in | Wt 157.0 lb

## 2022-02-14 DIAGNOSIS — I10 Essential (primary) hypertension: Secondary | ICD-10-CM

## 2022-02-14 DIAGNOSIS — G5612 Other lesions of median nerve, left upper limb: Secondary | ICD-10-CM | POA: Diagnosis not present

## 2022-02-14 DIAGNOSIS — R29898 Other symptoms and signs involving the musculoskeletal system: Secondary | ICD-10-CM | POA: Diagnosis not present

## 2022-02-14 LAB — CBC WITH DIFFERENTIAL/PLATELET
Basophils Absolute: 0 10*3/uL (ref 0.0–0.1)
Basophils Relative: 0.5 % (ref 0.0–3.0)
Eosinophils Absolute: 0 10*3/uL (ref 0.0–0.7)
Eosinophils Relative: 0.6 % (ref 0.0–5.0)
HCT: 42.7 % (ref 39.0–52.0)
Hemoglobin: 14.5 g/dL (ref 13.0–17.0)
Lymphocytes Relative: 27.2 % (ref 12.0–46.0)
Lymphs Abs: 1.8 10*3/uL (ref 0.7–4.0)
MCHC: 33.9 g/dL (ref 30.0–36.0)
MCV: 103.5 fl — ABNORMAL HIGH (ref 78.0–100.0)
Monocytes Absolute: 0.7 10*3/uL (ref 0.1–1.0)
Monocytes Relative: 10.4 % (ref 3.0–12.0)
Neutro Abs: 4.1 10*3/uL (ref 1.4–7.7)
Neutrophils Relative %: 61.3 % (ref 43.0–77.0)
Platelets: 246 10*3/uL (ref 150.0–400.0)
RBC: 4.12 Mil/uL — ABNORMAL LOW (ref 4.22–5.81)
RDW: 14.7 % (ref 11.5–15.5)
WBC: 6.7 10*3/uL (ref 4.0–10.5)

## 2022-02-14 LAB — COMPREHENSIVE METABOLIC PANEL
ALT: 16 U/L (ref 0–53)
AST: 21 U/L (ref 0–37)
Albumin: 4.6 g/dL (ref 3.5–5.2)
Alkaline Phosphatase: 39 U/L (ref 39–117)
BUN: 19 mg/dL (ref 6–23)
CO2: 26 mEq/L (ref 19–32)
Calcium: 10.3 mg/dL (ref 8.4–10.5)
Chloride: 102 mEq/L (ref 96–112)
Creatinine, Ser: 1.18 mg/dL (ref 0.40–1.50)
GFR: 62.54 mL/min (ref >60.00)
Glucose, Bld: 103 mg/dL — ABNORMAL HIGH (ref 70–99)
Potassium: 4.2 mEq/L (ref 3.5–5.1)
Sodium: 140 mEq/L (ref 135–145)
Total Bilirubin: 0.6 mg/dL (ref 0.2–1.2)
Total Protein: 7.4 g/dL (ref 6.0–8.3)

## 2022-02-14 LAB — LIPID PANEL
Cholesterol: 212 mg/dL — ABNORMAL HIGH (ref 0–200)
HDL: 101.1 mg/dL (ref >39.00)
LDL Cholesterol: 94 mg/dL (ref 0–99)
NonHDL: 110.61
Total CHOL/HDL Ratio: 2
Triglycerides: 82 mg/dL (ref 0.0–149.0)
VLDL: 16.4 mg/dL (ref 0.0–40.0)

## 2022-02-14 LAB — SEDIMENTATION RATE: Sed Rate: 27 mm/hr — ABNORMAL HIGH (ref 0–20)

## 2022-02-14 LAB — VITAMIN B12: Vitamin B-12: 124 pg/mL — ABNORMAL LOW (ref 211–911)

## 2022-02-14 LAB — TSH: TSH: 1.69 u[IU]/mL (ref 0.35–5.50)

## 2022-02-14 NOTE — Progress Notes (Signed)
Daniel Lamb ?71 y.o. ? ? ?Chief Complaint  ?Patient presents with  ? Extremity Weakness  ?  Left arm felt weak after bench pressing 60 lbs at gym a week after  ? ? ?HISTORY OF PRESENT ILLNESS: ?This is a 71 y.o. male complaining of weakness to left arm. ?Noticed weakness about 6 weeks ago the day after bench pressing at the gym. ?Had no other associated symptoms.  No slurred speech, facial droop, any other weakness, visual disturbances, headache, nausea or vomiting, dizziness or balance issues. ?Still experiencing some weakness of the left arm. ?No other complaints or medical concerns today. ? ?HPI ? ? ?Prior to Admission medications   ?Medication Sig Start Date End Date Taking? Authorizing Provider  ?clotrimazole-betamethasone (LOTRISONE) cream Apply 1 application topically 2 (two) times daily. ?Patient taking differently: Apply 1 application. topically as needed. 03/30/21  Yes Shaquan Puerta, Ines Bloomer, MD  ?lisinopril (ZESTRIL) 20 MG tablet TAKE 1 TABLET BY MOUTH EVERY DAY 11/29/21  Yes Jassen Sarver, Ines Bloomer, MD  ?sildenafil (REVATIO) 20 MG tablet TAKE 1-2 TABLETS BY MOUTH DAILY AS NEEDED 1 HR PRIOR TO SEXUAL ACTIVITY FOR MAXIMAL EFFECTIVENESS. 03/30/21  Yes Kadesha Virrueta, Ines Bloomer, MD  ?valACYclovir (VALTREX) 1000 MG tablet Take 1 tablet (1,000 mg total) by mouth 2 (two) times daily. ?Patient taking differently: Take 1,000 mg by mouth as needed. 11/04/20  Yes Posey Boyer, MD  ? ? ?No Known Allergies ? ?Patient Active Problem List  ? Diagnosis Date Noted  ? Dupuytren's contracture of left hand 05/18/2021  ? Osteoarthritis of both hands 03/30/2021  ? Essential hypertension 07/09/2018  ? Erectile dysfunction 07/09/2018  ? History of herpes genitalis 06/28/2015  ? ? ?Past Medical History:  ?Diagnosis Date  ? Genital herpes   ? Hypertension   ? ? ?Past Surgical History:  ?Procedure Laterality Date  ? VASECTOMY    ? ? ?Social History  ? ?Socioeconomic History  ? Marital status: Married  ?  Spouse name: Not on file  ?  Number of children: Not on file  ? Years of education: Not on file  ? Highest education level: Not on file  ?Occupational History  ? Not on file  ?Tobacco Use  ? Smoking status: Never  ? Smokeless tobacco: Never  ?Vaping Use  ? Vaping Use: Never used  ?Substance and Sexual Activity  ? Alcohol use: Yes  ? Drug use: No  ? Sexual activity: Not on file  ?Other Topics Concern  ? Not on file  ?Social History Narrative  ? Not on file  ? ?Social Determinants of Health  ? ?Financial Resource Strain: Not on file  ?Food Insecurity: Not on file  ?Transportation Needs: Not on file  ?Physical Activity: Not on file  ?Stress: Not on file  ?Social Connections: Not on file  ?Intimate Partner Violence: Not on file  ? ? ?Family History  ?Problem Relation Age of Onset  ? Cancer Mother   ? Cancer Father   ? COPD Brother   ? Lung cancer Brother   ? ? ? ?Review of Systems  ?Constitutional: Negative.  Negative for chills and fever.  ?HENT: Negative.  Negative for congestion and sore throat.   ?Eyes: Negative.   ?Respiratory: Negative.  Negative for cough and shortness of breath.   ?Cardiovascular: Negative.  Negative for chest pain and palpitations.  ?Gastrointestinal: Negative.  Negative for abdominal pain, diarrhea, nausea and vomiting.  ?Genitourinary: Negative.  Negative for dysuria and hematuria.  ?Musculoskeletal: Negative.  Negative for back pain  and neck pain.  ?Skin: Negative.  Negative for rash.  ?Neurological:  Positive for focal weakness (Left arm). Negative for dizziness, sensory change, speech change, seizures, loss of consciousness and headaches.  ?All other systems reviewed and are negative. ? ?Today's Vitals  ? 02/14/22 1429 02/14/22 1439  ?BP: (!) 164/78 130/70  ?Pulse: 69   ?SpO2: 96%   ?Weight: 157 lb (71.2 kg)   ?Height: '5\' 5"'$  (1.651 m)   ? ?Body mass index is 26.13 kg/m?. ? ?Physical Exam ?Vitals reviewed.  ?Constitutional:   ?   Appearance: Normal appearance.  ?HENT:  ?   Head: Normocephalic.  ?Eyes:  ?   Extraocular  Movements: Extraocular movements intact.  ?   Conjunctiva/sclera: Conjunctivae normal.  ?   Pupils: Pupils are equal, round, and reactive to light.  ?Neck:  ?   Vascular: No carotid bruit.  ?Cardiovascular:  ?   Rate and Rhythm: Normal rate and regular rhythm.  ?   Pulses: Normal pulses.  ?   Heart sounds: Normal heart sounds.  ?Pulmonary:  ?   Effort: Pulmonary effort is normal.  ?   Breath sounds: Normal breath sounds.  ?Musculoskeletal:  ?   Cervical back: No tenderness.  ?Lymphadenopathy:  ?   Cervical: No cervical adenopathy.  ?Skin: ?   General: Skin is warm and dry.  ?   Capillary Refill: Capillary refill takes less than 2 seconds.  ?Neurological:  ?   General: No focal deficit present.  ?   Mental Status: He is alert and oriented to person, place, and time.  ?   Cranial Nerves: No cranial nerve deficit.  ?   Sensory: No sensory deficit.  ?   Motor: Weakness (Left upper arm) present.  ?   Coordination: Coordination normal.  ?   Gait: Gait normal.  ?   Deep Tendon Reflexes: Reflexes normal.  ?Psychiatric:     ?   Mood and Affect: Mood normal.     ?   Behavior: Behavior normal.  ? ? ? ?ASSESSMENT & PLAN: ?A total of 49 minutes was spent with the patient and counseling/coordination of care regarding preparing for this visit, review of most recent office visit notes, review of most recent blood work results, differential diagnosis of left arm weakness and need for neurology evaluation including brain imaging, hypertension and its management, review of all medications, prognosis, documentation and need for follow-up after neurology evaluation. ? ?Problem List Items Addressed This Visit   ? ?  ? Cardiovascular and Mediastinum  ? Essential hypertension  ?  Well-controlled hypertension with normal blood pressure readings at home. ?Continue lisinopril 20 mg daily. ?  ?  ? Relevant Orders  ? Vitamin B12  ? CBC with Differential/Platelet  ? Comprehensive metabolic panel  ? Lipid panel  ? TSH  ? Sedimentation rate  ? DG  Chest 2 View (Completed)  ?  ? Nervous and Auditory  ? Left arm weakness - Primary  ?  Localized to left upper arm, of 6 weeks duration. ?CVA in the differential diagnosis.  Localized neuropraxia needs to be considered. ?Needs neurology evaluation and possible nerve conduction studies. ?Will need brain imaging as well. ?Neurology referral placed today. ?  ?  ? Relevant Orders  ? Ambulatory referral to Neurology  ? Vitamin B12  ? CBC with Differential/Platelet  ? Comprehensive metabolic panel  ? Lipid panel  ? TSH  ? Sedimentation rate  ? DG Chest 2 View (Completed)  ? Median neuropathy at upper  arm, left  ? Relevant Orders  ? Ambulatory referral to Neurology  ? Vitamin B12  ? CBC with Differential/Platelet  ? Comprehensive metabolic panel  ? Lipid panel  ? TSH  ? Sedimentation rate  ? ?Patient Instructions  ?Health Maintenance After Age 41 ?After age 83, you are at a higher risk for certain long-term diseases and infections as well as injuries from falls. Falls are a major cause of broken bones and head injuries in people who are older than age 7. Getting regular preventive care can help to keep you healthy and well. Preventive care includes getting regular testing and making lifestyle changes as recommended by your health care provider. Talk with your health care provider about: ?Which screenings and tests you should have. A screening is a test that checks for a disease when you have no symptoms. ?A diet and exercise plan that is right for you. ?What should I know about screenings and tests to prevent falls? ?Screening and testing are the best ways to find a health problem early. Early diagnosis and treatment give you the best chance of managing medical conditions that are common after age 18. Certain conditions and lifestyle choices may make you more likely to have a fall. Your health care provider may recommend: ?Regular vision checks. Poor vision and conditions such as cataracts can make you more likely to have  a fall. If you wear glasses, make sure to get your prescription updated if your vision changes. ?Medicine review. Work with your health care provider to regularly review all of the medicines you are t

## 2022-02-14 NOTE — Assessment & Plan Note (Signed)
Localized to left upper arm, of 6 weeks duration. ?CVA in the differential diagnosis.  Localized neuropraxia needs to be considered. ?Needs neurology evaluation and possible nerve conduction studies. ?Will need brain imaging as well. ?Neurology referral placed today. ?

## 2022-02-14 NOTE — Patient Instructions (Signed)
Health Maintenance After Age 71 After age 71, you are at a higher risk for certain long-term diseases and infections as well as injuries from falls. Falls are a major cause of broken bones and head injuries in people who are older than age 71. Getting regular preventive care can help to keep you healthy and well. Preventive care includes getting regular testing and making lifestyle changes as recommended by your health care provider. Talk with your health care provider about: Which screenings and tests you should have. A screening is a test that checks for a disease when you have no symptoms. A diet and exercise plan that is right for you. What should I know about screenings and tests to prevent falls? Screening and testing are the best ways to find a health problem early. Early diagnosis and treatment give you the best chance of managing medical conditions that are common after age 71. Certain conditions and lifestyle choices may make you more likely to have a fall. Your health care provider may recommend: Regular vision checks. Poor vision and conditions such as cataracts can make you more likely to have a fall. If you wear glasses, make sure to get your prescription updated if your vision changes. Medicine review. Work with your health care provider to regularly review all of the medicines you are taking, including over-the-counter medicines. Ask your health care provider about any side effects that may make you more likely to have a fall. Tell your health care provider if any medicines that you take make you feel dizzy or sleepy. Strength and balance checks. Your health care provider may recommend certain tests to check your strength and balance while standing, walking, or changing positions. Foot health exam. Foot pain and numbness, as well as not wearing proper footwear, can make you more likely to have a fall. Screenings, including: Osteoporosis screening. Osteoporosis is a condition that causes  the bones to get weaker and break more easily. Blood pressure screening. Blood pressure changes and medicines to control blood pressure can make you feel dizzy. Depression screening. You may be more likely to have a fall if you have a fear of falling, feel depressed, or feel unable to do activities that you used to do. Alcohol use screening. Using too much alcohol can affect your balance and may make you more likely to have a fall. Follow these instructions at home: Lifestyle Do not drink alcohol if: Your health care provider tells you not to drink. If you drink alcohol: Limit how much you have to: 0-1 drink a day for women. 0-2 drinks a day for men. Know how much alcohol is in your drink. In the [place] one drink equals one 12 oz bottle of beer (355 mL), one 5 oz glass of wine (148 mL), or one 1 oz glass of hard liquor (44 mL). Do not use any products that contain nicotine or tobacco. These products include cigarettes, chewing tobacco, and vaping devices, such as e-cigarettes. If you need help quitting, ask your health care provider. Activity  Follow a regular exercise program to stay fit. This will help you maintain your balance. Ask your health care provider what types of exercise are appropriate for you. If you need a cane or walker, use it as recommended by your health care provider. Wear supportive shoes that have nonskid soles. Safety  Remove any tripping hazards, such as rugs, cords, and clutter. Install safety equipment such as grab bars in bathrooms and safety rails on stairs. Keep rooms and walkways   well-lit. General instructions Talk with your health care provider about your risks for falling. Tell your health care provider if: You fall. Be sure to tell your health care provider about all falls, even ones that seem minor. You feel dizzy, tiredness (fatigue), or off-balance. Take over-the-counter and prescription medicines only as told by your health care provider. These include  supplements. Eat a healthy diet and maintain a healthy weight. A healthy diet includes low-fat dairy products, low-fat (lean) meats, and fiber from whole grains, beans, and lots of fruits and vegetables. Stay current with your vaccines. Schedule regular health, dental, and eye exams. Summary Having a healthy lifestyle and getting preventive care can help to protect your health and wellness after age 71. Screening and testing are the best way to find a health problem early and help you avoid having a fall. Early diagnosis and treatment give you the best chance for managing medical conditions that are more common for people who are older than age 71. Falls are a major cause of broken bones and head injuries in people who are older than age 71. Take precautions to prevent a fall at home. Work with your health care provider to learn what changes you can make to improve your health and wellness and to prevent falls. This information is not intended to replace advice given to you by your health care provider. Make sure you discuss any questions you have with your health care provider. Document Revised: 04/11/2021 Document Reviewed: 04/11/2021 Elsevier Patient Education  2022 Elsevier Inc.  

## 2022-02-14 NOTE — Assessment & Plan Note (Signed)
Well-controlled hypertension with normal blood pressure readings at home. ?Continue lisinopril 20 mg daily. ?

## 2022-02-20 ENCOUNTER — Other Ambulatory Visit (INDEPENDENT_AMBULATORY_CARE_PROVIDER_SITE_OTHER): Payer: Medicare Other

## 2022-02-20 ENCOUNTER — Other Ambulatory Visit: Payer: Self-pay

## 2022-02-20 ENCOUNTER — Encounter: Payer: Self-pay | Admitting: Neurology

## 2022-02-20 ENCOUNTER — Ambulatory Visit (INDEPENDENT_AMBULATORY_CARE_PROVIDER_SITE_OTHER): Payer: Medicare Other | Admitting: Neurology

## 2022-02-20 VITALS — BP 153/90 | HR 86 | Ht 65.0 in | Wt 158.0 lb

## 2022-02-20 DIAGNOSIS — R29898 Other symptoms and signs involving the musculoskeletal system: Secondary | ICD-10-CM | POA: Diagnosis not present

## 2022-02-20 DIAGNOSIS — E639 Nutritional deficiency, unspecified: Secondary | ICD-10-CM | POA: Diagnosis not present

## 2022-02-20 LAB — FOLATE: Folate: 8.1 ng/mL (ref >5.9)

## 2022-02-20 NOTE — Progress Notes (Signed)
?[facility] ?Neurology Division ?Clinic Note - Initial Visit ? ? ?Date: 02/20/22 ? ?Daniel Lamb ?MRN: [id number] ?DOB: [birth year] ? ? ?Dear Dr. Franky Macho: ? ?Thank you for your kind referral of Daniel Lamb for consultation of left arm weakness. Although his history is well known to you, please allow [place] to reiterate it for the purpose of our medical record. The patient was accompanied to the clinic by self. ?  ? ?History of Present Illness: ?Daniel Lamb is a 71 y.o. right-handed male with hypertension and ED presenting for evaluation of left arm weakness.  Starting in January 2023, he began noticing that when he was doing shoulder press with the arms, his left arm was having difficulty keeping up with the right side.  He typically pushes 60lb with each arm, but feels comfortable with only able to lift 30lb.  He goes to the gym weekly.  He then started doing modified pushups and again noticed that the more his arm bent the weaker it felt.  No radiating arm pain, neck pain, or numbness/tingling.  He denies any weakness with ADLs.  ? ?He drinks 2-3 glasses of wine daily.  He works as a Paramedic.   Recent vitamin B12 level shows deficiency at 124*.  ? ?Out-side paper records, electronic medical record, and images have been reviewed where available and summarized as:  ?Lab Results  ?Component Value Date  ? HGBA1C 5.4 07/13/2018  ? ?Lab Results  ?Component Value Date  ? VITAMINB12 124 (L) 02/14/2022  ? ?Lab Results  ?Component Value Date  ? TSH 1.69 02/14/2022  ? ?Lab Results  ?Component Value Date  ? ESRSEDRATE 27 (H) 02/14/2022  ? ? ?Past Medical History:  ?Diagnosis Date  ? Genital herpes   ? Hypertension   ? ? ?Past Surgical History:  ?Procedure Laterality Date  ? VASECTOMY    ? ? ? ?Medications:  ?Outpatient Encounter Medications as of 02/20/2022  ?Medication Sig  ? clotrimazole-betamethasone (LOTRISONE) cream Apply 1 application topically 2 (two) times daily. (Patient taking  differently: Apply 1 application. topically as needed.)  ? lisinopril (ZESTRIL) 20 MG tablet TAKE 1 TABLET BY MOUTH EVERY DAY  ? sildenafil (REVATIO) 20 MG tablet TAKE 1-2 TABLETS BY MOUTH DAILY AS NEEDED 1 HR PRIOR TO SEXUAL ACTIVITY FOR MAXIMAL EFFECTIVENESS.  ? valACYclovir (VALTREX) 1000 MG tablet Take 1 tablet (1,000 mg total) by mouth 2 (two) times daily. (Patient taking differently: Take 1,000 mg by mouth as needed.)  ? ?No facility-administered encounter medications on file as of 02/20/2022.  ? ? ?Allergies: No Known Allergies ? ?Family History: ?Family History  ?Problem Relation Age of Onset  ? Cancer Mother   ? Cancer Father   ? COPD Brother   ? Lung cancer Brother   ? ? ?Social History: ?Social History  ? ?Tobacco Use  ? Smoking status: Never  ? Smokeless tobacco: Never  ?Vaping Use  ? Vaping Use: Never used  ?Substance Use Topics  ? Alcohol use: Yes  ? Drug use: No  ? ?Social History  ? ?Social History Narrative  ? Right handed   ? ? ?Vital Signs:  ?BP (!) 153/90   Pulse 86   Ht '5\' 5"'$  (1.651 m)   Wt 158 lb (71.7 kg)   SpO2 96%   BMI 26.29 kg/m?  ? ?Neurological Exam: ?MENTAL STATUS including orientation to time, place, person, recent and remote memory, attention span and concentration, language, and fund of knowledge is normal.  Speech is  not dysarthric. ? ?CRANIAL NERVES: ?II:  No visual field defects.  ?III-IV-VI: Pupils equal round and reactive to light.  Normal conjugate, extra-ocular eye movements in all directions of gaze.  No nystagmus.  No ptosis.   ?V:  Normal facial sensation.    ?VII:  Normal facial symmetry and movements.   ?VIII:  Normal hearing and vestibular function.   ?IX-X:  Normal palatal movement.   ?XI:  Normal shoulder shrug and head rotation.   ?XII:  Normal tongue strength and range of motion, no deviation or fasciculation. ? ?MOTOR:  No atrophy, fasciculations or abnormal movements.  No pronator drift.  ?No winged scapular ?Upper Extremity:  Right  Left  ?Deltoid  5/5   5/5    ?Biceps  5/5   5/5   ?Triceps  5/5   5/5   ?Infraspinatus 5/5  5/5  ?Medial pectoralis 5/5  5/5  ?Wrist extensors  5/5   5/5   ?Wrist flexors  5/5   5/5   ?Finger extensors  5/5   5/5   ?Finger flexors  5/5   5/5   ?Dorsal interossei  5/5   5/5   ?Abductor pollicis  5/5   5/5   ?Tone (Ashworth scale)  0  0  ? ?Lower Extremity:  Right  Left  ?Hip flexors  5/5   5/5   ?Knee flexors  5/5   5/5   ?Knee extensors  5/5   5/5   ?Dorsiflexors  5/5   5/5   ?Plantarflexors  5/5   5/5   ?Toe extensors  5/5   5/5   ?Toe flexors  5/5   5/5   ?Tone (Ashworth scale)  0  0  ? ?MSRs:  ?Right        Left                  ?brachioradialis 2+  2+  ?biceps 2+  2+  ?triceps 2+  2+  ?patellar 2+  2+  ?ankle jerk 2+  2+  ?Hoffman no  no  ?plantar response down  down  ? ?SENSORY:  Normal and symmetric perception of light touch, pinprick, vibration, and proprioception.  Romberg's sign absent.  ? ?COORDINATION/GAIT: Normal finger-to- nose-finger and heel-to-shin.  Intact rapid alternating movements bilaterally.  Able to rise from a chair without using arms.  Gait narrow based and stable. Tandem and stressed gait intact.  ? ? ?IMPRESSION: ?Left arm weakness only with arm extension without objective weakness or atrophy of the triceps or shoulder girdle muscles.  Reflexes and sensation are normal.  Exam or history is not suggestive of radiculopathy.  To be complete, he will return for NCS/EMG of the left arm.  If NCS/EMG of the arm is normal, he may benefit from seeing sports medicine to see if this could be due to isolated tendinopathy.  ? ?Vitamin B12 deficiency in the setting of alcohol use, will screen for additional nutrient deficiency ? - Check vitamin B1, folate ? ?Further recommendations pending results. ? ?Thank you for allowing me to participate in patient's care.  If I can answer any additional questions, I would be pleased to do so.   ? ?Sincerely, ? ? ? ?Lashaunda Schild K. Posey Pronto, DO ? ?

## 2022-02-20 NOTE — Patient Instructions (Addendum)
Nerve testing left arm  ?Start taking vitamin B12 1087mg daily ?Check labs  ? ?ELECTROMYOGRAM AND NERVE CONDUCTION STUDIES (EMG/NCS) INSTRUCTIONS ? ?How to Prepare ?The neurologist conducting the EMG will need to know if you have certain medical conditions. Tell the neurologist and other EMG lab personnel if you: ?Have a pacemaker or any other electrical medical device ?Take blood-thinning medications ?Have hemophilia, a blood-clotting disorder that causes prolonged bleeding ?Bathing ?Take a shower or bath shortly before your exam in order to remove oils from your skin. Don?t apply lotions or creams before the exam.  ?What to Expect ?You?ll likely be asked to change into a hospital gown for the procedure and lie down on an examination table. The following explanations can help you understand what will happen during the exam.  ?Electrodes. The neurologist or a technician places surface electrodes at various locations on your skin depending on where you?re experiencing symptoms. Or the neurologist may insert needle electrodes at different sites depending on your symptoms.  ?Sensations. The electrodes will at times transmit a tiny electrical current that you may feel as a twinge or spasm. The needle electrode may cause discomfort or pain that usually ends shortly after the needle is removed. ?If you are concerned about discomfort or pain, you may want to talk to the neurologist about taking a short break during the exam.  ?Instructions. During the needle EMG, the neurologist will assess whether there is any spontaneous electrical activity when the muscle is at rest - activity that isn?t present in healthy muscle tissue - and the degree of activity when you slightly contract the muscle.  ?He or she will give you instructions on resting and contracting a muscle at appropriate times. Depending on what muscles and nerves the neurologist is examining, he or she may ask you to change positions during the exam.  ?After your  EMG ?You may experience some temporary, minor bruising where the needle electrode was inserted into your muscle. This bruising should fade within several days. If it persists, contact your primary care doctor.  ? ?

## 2022-02-25 LAB — VITAMIN B1: Vitamin B1 (Thiamine): 14 nmol/L (ref 8–30)

## 2022-02-27 ENCOUNTER — Other Ambulatory Visit: Payer: Self-pay | Admitting: Emergency Medicine

## 2022-02-27 DIAGNOSIS — I10 Essential (primary) hypertension: Secondary | ICD-10-CM

## 2022-03-13 ENCOUNTER — Telehealth: Payer: Self-pay | Admitting: Emergency Medicine

## 2022-03-13 NOTE — Telephone Encounter (Signed)
N/A unable to leave a message for patient to call back to schedule Medicare Annual Wellness Visit  ? ?No hx of AWV eligible as of 01/04/19 ? ?Please schedule at anytime with [facility] if patient calls the office back.   ? ? ?Any questions, please call me at [phone]  ?

## 2022-03-20 ENCOUNTER — Telehealth: Payer: Self-pay

## 2022-03-20 NOTE — Telephone Encounter (Addendum)
Pt is requesting a new referral for Neurology due that provider going out on Maternity  leave and the earliest appt would be in Aug. ? ?Please advise ?

## 2022-03-21 ENCOUNTER — Encounter: Payer: Medicare Other | Admitting: Neurology

## 2022-03-23 ENCOUNTER — Encounter: Payer: Self-pay | Admitting: Neurology

## 2022-05-27 ENCOUNTER — Other Ambulatory Visit: Payer: Self-pay | Admitting: Emergency Medicine

## 2022-05-27 DIAGNOSIS — I10 Essential (primary) hypertension: Secondary | ICD-10-CM

## 2022-06-16 ENCOUNTER — Other Ambulatory Visit: Payer: Self-pay | Admitting: Emergency Medicine

## 2022-06-16 DIAGNOSIS — N529 Male erectile dysfunction, unspecified: Secondary | ICD-10-CM

## 2022-07-10 ENCOUNTER — Telehealth: Payer: Self-pay | Admitting: Neurology

## 2022-07-10 NOTE — Telephone Encounter (Signed)
I called patient to see if he wanted to move his EMG appt up and he wanted you to know the following.   Patient states that what was going on with him the last time you saw him is not going on. He states that it is back for full strength. He wants to know if he needs the EMG now or should he just cancel it.   Please advise

## 2022-07-10 NOTE — Telephone Encounter (Signed)
Glad to hear he is doing better. If he is no longer having arm weakness, OK to cancel EMG.

## 2022-07-10 NOTE — Telephone Encounter (Signed)
Left message on Voicemail for patient to see if he wanted to cancel EMG Appt. Ok to cancel per [employer]	[place]

## 2022-08-15 ENCOUNTER — Encounter: Payer: Medicare Other | Admitting: Neurology

## 2022-08-25 ENCOUNTER — Other Ambulatory Visit: Payer: Self-pay | Admitting: Emergency Medicine

## 2022-08-25 DIAGNOSIS — I10 Essential (primary) hypertension: Secondary | ICD-10-CM

## 2022-09-07 ENCOUNTER — Telehealth: Payer: Self-pay

## 2022-09-07 NOTE — Telephone Encounter (Signed)
Spoke with patient to get scheduled for AWV, states he will call back.

## 2022-10-02 ENCOUNTER — Ambulatory Visit (INDEPENDENT_AMBULATORY_CARE_PROVIDER_SITE_OTHER): Payer: Medicare Other | Admitting: Emergency Medicine

## 2022-10-02 ENCOUNTER — Encounter: Payer: Self-pay | Admitting: Emergency Medicine

## 2022-10-02 VITALS — BP 136/84 | HR 63 | Temp 98.5°F | Ht 65.0 in | Wt 157.2 lb

## 2022-10-02 DIAGNOSIS — Z130 Encounter for screening for diseases of the blood and blood-forming organs and certain disorders involving the immune mechanism: Secondary | ICD-10-CM

## 2022-10-02 DIAGNOSIS — I10 Essential (primary) hypertension: Secondary | ICD-10-CM

## 2022-10-02 DIAGNOSIS — Z0000 Encounter for general adult medical examination without abnormal findings: Secondary | ICD-10-CM

## 2022-10-02 DIAGNOSIS — Z1329 Encounter for screening for other suspected endocrine disorder: Secondary | ICD-10-CM

## 2022-10-02 DIAGNOSIS — Z13228 Encounter for screening for other metabolic disorders: Secondary | ICD-10-CM

## 2022-10-02 DIAGNOSIS — Z13220 Encounter for screening for lipoid disorders: Secondary | ICD-10-CM

## 2022-10-02 DIAGNOSIS — Z1211 Encounter for screening for malignant neoplasm of colon: Secondary | ICD-10-CM

## 2022-10-02 LAB — COMPREHENSIVE METABOLIC PANEL
ALT: 16 U/L (ref 0–53)
AST: 23 U/L (ref 0–37)
Albumin: 4.3 g/dL (ref 3.5–5.2)
Alkaline Phosphatase: 38 U/L — ABNORMAL LOW (ref 39–117)
BUN: 14 mg/dL (ref 6–23)
CO2: 28 mEq/L (ref 19–32)
Calcium: 9.2 mg/dL (ref 8.4–10.5)
Chloride: 99 mEq/L (ref 96–112)
Creatinine, Ser: 0.9 mg/dL (ref 0.40–1.50)
GFR: 86.18 mL/min (ref >60.00)
Glucose, Bld: 96 mg/dL (ref 70–99)
Potassium: 4.2 mEq/L (ref 3.5–5.1)
Sodium: 136 mEq/L (ref 135–145)
Total Bilirubin: 0.7 mg/dL (ref 0.2–1.2)
Total Protein: 7.1 g/dL (ref 6.0–8.3)

## 2022-10-02 LAB — CBC WITH DIFFERENTIAL/PLATELET
Basophils Absolute: 0 10*3/uL (ref 0.0–0.1)
Basophils Relative: 0.7 % (ref 0.0–3.0)
Eosinophils Absolute: 0.1 10*3/uL (ref 0.0–0.7)
Eosinophils Relative: 1.2 % (ref 0.0–5.0)
HCT: 40 % (ref 39.0–52.0)
Hemoglobin: 13.8 g/dL (ref 13.0–17.0)
Lymphocytes Relative: 30.1 % (ref 12.0–46.0)
Lymphs Abs: 1.5 10*3/uL (ref 0.7–4.0)
MCHC: 34.4 g/dL (ref 30.0–36.0)
MCV: 103.6 fl — ABNORMAL HIGH (ref 78.0–100.0)
Monocytes Absolute: 0.6 10*3/uL (ref 0.1–1.0)
Monocytes Relative: 11.7 % (ref 3.0–12.0)
Neutro Abs: 2.9 10*3/uL (ref 1.4–7.7)
Neutrophils Relative %: 56.3 % (ref 43.0–77.0)
Platelets: 216 10*3/uL (ref 150.0–400.0)
RBC: 3.87 Mil/uL — ABNORMAL LOW (ref 4.22–5.81)
RDW: 14.3 % (ref 11.5–15.5)
WBC: 5.1 10*3/uL (ref 4.0–10.5)

## 2022-10-02 LAB — LIPID PANEL
Cholesterol: 197 mg/dL (ref 0–200)
HDL: 83.2 mg/dL (ref >39.00)
LDL Cholesterol: 103 mg/dL — ABNORMAL HIGH (ref 0–99)
NonHDL: 113.54
Total CHOL/HDL Ratio: 2
Triglycerides: 51 mg/dL (ref 0.0–149.0)
VLDL: 10.2 mg/dL (ref 0.0–40.0)

## 2022-10-02 NOTE — Progress Notes (Signed)
Daniel Lamb 71 y.o.   Chief Complaint  Patient presents with   Annual Exam    No concerns     HISTORY OF PRESENT ILLNESS: This is a 71 y.o. male with history hypertension here for annual exam. Overall doing well. Has no complaints or medical concerns today. Left-sided arm weakness detected 6 months ago is now gone.  No concerns.  HPI   Prior to Admission medications   Medication Sig Start Date End Date Taking? Authorizing Provider  clotrimazole-betamethasone (LOTRISONE) cream Apply 1 application topically 2 (two) times daily. Patient taking differently: Apply 1 application  topically as needed. 03/30/21  Yes Ayat Drenning, Ines Bloomer, MD  lisinopril (ZESTRIL) 20 MG tablet TAKE 1 TABLET BY MOUTH EVERY DAY 08/25/22  Yes Meaghen Vecchiarelli, Ines Bloomer, MD  sildenafil (REVATIO) 20 MG tablet TAKE ONE TO TWO TABLETS BY MOUTH DAILY AS NEEDED ONE HOUR PRIOR TO SEXUAL ACTIVITY FOR MAXIMAL EFFECTIVENESS 06/16/22  Yes Aadit Hagood, Ines Bloomer, MD  valACYclovir (VALTREX) 1000 MG tablet Take 1 tablet (1,000 mg total) by mouth 2 (two) times daily. Patient taking differently: Take 1,000 mg by mouth as needed. 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, left 02/14/2022   Dupuytren's contracture of left hand 05/18/2021   Osteoarthritis of both hands 03/30/2021   Essential hypertension 07/09/2018   Erectile dysfunction 07/09/2018   History of herpes genitalis 06/28/2015    Past Medical History:  Diagnosis Date   Genital herpes    Hypertension     Past Surgical History:  Procedure Laterality Date   VASECTOMY      Social History   Socioeconomic History   Marital status: Married    Spouse name: Not on file   Number of children: Not on file   Years of education: Not on file   Highest education level: Not on file  Occupational History   Not on file  Tobacco Use    Smoking status: Never   Smokeless tobacco: Never  Vaping Use   Vaping Use: Never used  Substance and Sexual Activity   Alcohol use: Yes   Drug use: No   Sexual activity: Not on file  Other Topics Concern   Not on file  Social History Narrative   Right handed    Social Determinants of Health   Financial Resource Strain: Not on file  Food Insecurity: Not on file  Transportation Needs: Not on file  Physical Activity: Not on file  Stress: Not on file  Social Connections: Not on file  Intimate Partner Violence: Not on file    Family History  Problem Relation Age of Onset   Cancer Mother    Cancer Father    COPD Brother    Lung cancer Brother      Review of Systems  Constitutional: Negative.  Negative for chills and fever.  HENT: Negative.  Negative for congestion and sore throat.   Respiratory: Negative.  Negative for cough and shortness of breath.   Cardiovascular: Negative.  Negative for chest pain and palpitations.  Gastrointestinal: Negative.  Negative for abdominal pain, nausea and vomiting.  Genitourinary: Negative.  Negative for dysuria and hematuria.  Skin: Negative.  Negative for rash.  Neurological: Negative.  Negative for dizziness and headaches.  All other systems reviewed and are negative.  Today's Vitals   10/02/22 1016  BP: 136/84  Pulse: 63  Temp: 98.5 F (36.9 C)  TempSrc: Oral  SpO2: 96%  Weight: 157 lb 4 oz (71.3 kg)  Height: '5\' 5"'$  (1.651 m)   Body mass index is 26.17 kg/m.    Physical Exam Vitals reviewed.  Constitutional:      Appearance: Normal appearance.  HENT:     Head: Normocephalic.     Right Ear: Tympanic membrane, ear canal and external ear normal.     Left Ear: Tympanic membrane, ear canal and external ear normal.     Mouth/Throat:     Mouth: Mucous membranes are moist.     Pharynx: Oropharynx is clear.  Eyes:     Extraocular Movements: Extraocular movements intact.     Conjunctiva/sclera: Conjunctivae normal.      Pupils: Pupils are equal, round, and reactive to light.  Cardiovascular:     Rate and Rhythm: Normal rate and regular rhythm.     Pulses: Normal pulses.     Heart sounds: Normal heart sounds.  Pulmonary:     Effort: Pulmonary effort is normal.     Breath sounds: Normal breath sounds.  Abdominal:     Palpations: Abdomen is soft.     Tenderness: There is no abdominal tenderness.  Musculoskeletal:        General: Normal range of motion.     Cervical back: No tenderness.  Lymphadenopathy:     Cervical: No cervical adenopathy.  Skin:    General: Skin is warm and dry.     Capillary Refill: Capillary refill takes less than 2 seconds.  Neurological:     General: No focal deficit present.     Mental Status: He is alert and oriented to person, place, and time.  Psychiatric:        Mood and Affect: Mood normal.        Behavior: Behavior normal.      ASSESSMENT & PLAN: Problem List Items Addressed This Visit       Cardiovascular and Mediastinum   Essential hypertension    Well-controlled hypertension. Continue lisinopril 20 mg daily. BP Readings from Last 3 Encounters:  10/02/22 136/84  02/20/22 (!) 153/90  02/14/22 130/70         Relevant Orders   CBC with Differential   Comprehensive metabolic panel   Other Visit Diagnoses     Routine general medical examination at a health care facility    -  Primary   Colon cancer screening       Relevant Orders   Ambulatory referral to Gastroenterology   Screening for deficiency anemia       Relevant Orders   CBC with Differential   Screening for lipoid disorders       Relevant Orders   Lipid panel   Screening for endocrine, metabolic and immunity disorder          Modifiable risk factors discussed with patient. Anticipatory guidance according to age provided. The following topics were also discussed: Social Determinants of Health Smoking.  Non-smoker Diet and nutrition.  Good eating habits Benefits of exercise.   Exercises regularly Cancer screening and review of most recent colonoscopy report. Vaccinations reviewed and recommendations Cardiovascular risk assessment and need for blood work today. Mental health including depression and anxiety Fall and accident prevention  Patient Instructions  Health Maintenance, Male Adopting a healthy lifestyle and getting preventive care are important in promoting health and wellness. Ask your health care provider about: The right schedule for you to have regular tests and  exams. Things you can do on your own to prevent diseases and keep yourself healthy. What should I know about diet, weight, and exercise? Eat a healthy diet  Eat a diet that includes plenty of vegetables, fruits, low-fat dairy products, and lean protein. Do not eat a lot of foods that are high in solid fats, added sugars, or sodium. Maintain a healthy weight Body mass index (BMI) is a measurement that can be used to identify possible weight problems. It estimates body fat based on height and weight. Your health care provider can help determine your BMI and help you achieve or maintain a healthy weight. Get regular exercise Get regular exercise. This is one of the most important things you can do for your health. Most adults should: Exercise for at least 150 minutes each week. The exercise should increase your heart rate and make you sweat (moderate-intensity exercise). Do strengthening exercises at least twice a week. This is in addition to the moderate-intensity exercise. Spend less time sitting. Even light physical activity can be beneficial. Watch cholesterol and blood lipids Have your blood tested for lipids and cholesterol at 71 years of age, then have this test every 5 years. You may need to have your cholesterol levels checked more often if: Your lipid or cholesterol levels are high. You are older than 71 years of age. You are at high risk for heart disease. What should I know about  cancer screening? Many types of cancers can be detected early and may often be prevented. Depending on your health history and family history, you may need to have cancer screening at various ages. This may include screening for: Colorectal cancer. Prostate cancer. Skin cancer. Lung cancer. What should I know about heart disease, diabetes, and high blood pressure? Blood pressure and heart disease High blood pressure causes heart disease and increases the risk of stroke. This is more likely to develop in people who have high blood pressure readings or are overweight. Talk with your health care provider about your target blood pressure readings. Have your blood pressure checked: Every 3-5 years if you are 58-54 years of age. Every year if you are 51 years old or older. If you are between the ages of 1 and 68 and are a current or former smoker, ask your health care provider if you should have a one-time screening for abdominal aortic aneurysm (AAA). Diabetes Have regular diabetes screenings. This checks your fasting blood sugar level. Have the screening done: Once every three years after age 71 if you are at a normal weight and have a low risk for diabetes. More often and at a younger age if you are overweight or have a high risk for diabetes. What should I know about preventing infection? Hepatitis B If you have a higher risk for hepatitis B, you should be screened for this virus. Talk with your health care provider to find out if you are at risk for hepatitis B infection. Hepatitis C Blood testing is recommended for: Everyone born from [birth year] through 1965. Anyone with known risk factors for hepatitis C. Sexually transmitted infections (STIs) You should be screened each year for STIs, including gonorrhea and chlamydia, if: You are sexually active and are younger than 71 years of age. You are older than 71 years of age and your health care provider tells you that you are at risk for this type  of infection. Your sexual activity has changed since you were last screened, and you are at increased risk for chlamydia  or gonorrhea. Ask your health care provider if you are at risk. Ask your health care provider about whether you are at high risk for HIV. Your health care provider may recommend a prescription medicine to help prevent HIV infection. If you choose to take medicine to prevent HIV, you should first get tested for HIV. You should then be tested every 3 months for as long as you are taking the medicine. Follow these instructions at home: Alcohol use Do not drink alcohol if your health care provider tells you not to drink. If you drink alcohol: Limit how much you have to 0-2 drinks a day. Know how much alcohol is in your drink. In the [place] one drink equals one 12 oz bottle of beer (355 mL), one 5 oz glass of wine (148 mL), or one 1 oz glass of hard liquor (44 mL). Lifestyle Do not use any products that contain nicotine or tobacco. These products include cigarettes, chewing tobacco, and vaping devices, such as e-cigarettes. If you need help quitting, ask your health care provider. Do not use street drugs. Do not share needles. Ask your health care provider for help if you need support or information about quitting drugs. General instructions Schedule regular health, dental, and eye exams. Stay current with your vaccines. Tell your health care provider if: You often feel depressed. You have ever been abused or do not feel safe at home. Summary Adopting a healthy lifestyle and getting preventive care are important in promoting health and wellness. Follow your health care provider's instructions about healthy diet, exercising, and getting tested or screened for diseases. Follow your health care provider's instructions on monitoring your cholesterol and blood pressure. This information is not intended to replace advice given to you by your health care provider. Make sure you discuss  any questions you have with your health care provider. Document Revised: 04/11/2021 Document Reviewed: 04/11/2021 Elsevier Patient Education  [place], MD [facility]

## 2022-10-02 NOTE — Patient Instructions (Signed)
Health Maintenance, Male Adopting a healthy lifestyle and getting preventive care are important in promoting health and wellness. Ask your health care provider about: The right schedule for you to have regular tests and exams. Things you can do on your own to prevent diseases and keep yourself healthy. What should I know about diet, weight, and exercise? Eat a healthy diet  Eat a diet that includes plenty of vegetables, fruits, low-fat dairy products, and lean protein. Do not eat a lot of foods that are high in solid fats, added sugars, or sodium. Maintain a healthy weight Body mass index (BMI) is a measurement that can be used to identify possible weight problems. It estimates body fat based on height and weight. Your health care provider can help determine your BMI and help you achieve or maintain a healthy weight. Get regular exercise Get regular exercise. This is one of the most important things you can do for your health. Most adults should: Exercise for at least 150 minutes each week. The exercise should increase your heart rate and make you sweat (moderate-intensity exercise). Do strengthening exercises at least twice a week. This is in addition to the moderate-intensity exercise. Spend less time sitting. Even light physical activity can be beneficial. Watch cholesterol and blood lipids Have your blood tested for lipids and cholesterol at 71 years of age, then have this test every 5 years. You may need to have your cholesterol levels checked more often if: Your lipid or cholesterol levels are high. You are older than 71 years of age. You are at high risk for heart disease. What should I know about cancer screening? Many types of cancers can be detected early and may often be prevented. Depending on your health history and family history, you may need to have cancer screening at various ages. This may include screening for: Colorectal cancer. Prostate cancer. Skin cancer. Lung  cancer. What should I know about heart disease, diabetes, and high blood pressure? Blood pressure and heart disease High blood pressure causes heart disease and increases the risk of stroke. This is more likely to develop in people who have high blood pressure readings or are overweight. Talk with your health care provider about your target blood pressure readings. Have your blood pressure checked: Every 3-5 years if you are 18-39 years of age. Every year if you are 40 years old or older. If you are between the ages of 65 and 75 and are a current or former smoker, ask your health care provider if you should have a one-time screening for abdominal aortic aneurysm (AAA). Diabetes Have regular diabetes screenings. This checks your fasting blood sugar level. Have the screening done: Once every three years after age 45 if you are at a normal weight and have a low risk for diabetes. More often and at a younger age if you are overweight or have a high risk for diabetes. What should I know about preventing infection? Hepatitis B If you have a higher risk for hepatitis B, you should be screened for this virus. Talk with your health care provider to find out if you are at risk for hepatitis B infection. Hepatitis C Blood testing is recommended for: Everyone born from 1945 through 1965. Anyone with known risk factors for hepatitis C. Sexually transmitted infections (STIs) You should be screened each year for STIs, including gonorrhea and chlamydia, if: You are sexually active and are younger than 71 years of age. You are older than 71 years of age and your   health care provider tells you that you are at risk for this type of infection. Your sexual activity has changed since you were last screened, and you are at increased risk for chlamydia or gonorrhea. Ask your health care provider if you are at risk. Ask your health care provider about whether you are at high risk for HIV. Your health care provider  may recommend a prescription medicine to help prevent HIV infection. If you choose to take medicine to prevent HIV, you should first get tested for HIV. You should then be tested every 3 months for as long as you are taking the medicine. Follow these instructions at home: Alcohol use Do not drink alcohol if your health care provider tells you not to drink. If you drink alcohol: Limit how much you have to 0-2 drinks a day. Know how much alcohol is in your drink. In the [place] one drink equals one 12 oz bottle of beer (355 mL), one 5 oz glass of wine (148 mL), or one 1 oz glass of hard liquor (44 mL). Lifestyle Do not use any products that contain nicotine or tobacco. These products include cigarettes, chewing tobacco, and vaping devices, such as e-cigarettes. If you need help quitting, ask your health care provider. Do not use street drugs. Do not share needles. Ask your health care provider for help if you need support or information about quitting drugs. General instructions Schedule regular health, dental, and eye exams. Stay current with your vaccines. Tell your health care provider if: You often feel depressed. You have ever been abused or do not feel safe at home. Summary Adopting a healthy lifestyle and getting preventive care are important in promoting health and wellness. Follow your health care provider's instructions about healthy diet, exercising, and getting tested or screened for diseases. Follow your health care provider's instructions on monitoring your cholesterol and blood pressure. This information is not intended to replace advice given to you by your health care provider. Make sure you discuss any questions you have with your health care provider. Document Revised: 04/11/2021 Document Reviewed: 04/11/2021 Elsevier Patient Education  2023 [employer]  

## 2022-10-02 NOTE — Assessment & Plan Note (Signed)
Well-controlled hypertension. Continue lisinopril 20 mg daily. BP Readings from Last 3 Encounters:  10/02/22 136/84  02/20/22 (!) 153/90  02/14/22 130/70

## 2023-02-01 ENCOUNTER — Other Ambulatory Visit: Payer: Self-pay | Admitting: Emergency Medicine

## 2023-02-01 DIAGNOSIS — I10 Essential (primary) hypertension: Secondary | ICD-10-CM

## 2023-04-30 ENCOUNTER — Other Ambulatory Visit: Payer: Self-pay | Admitting: Emergency Medicine

## 2023-04-30 DIAGNOSIS — I10 Essential (primary) hypertension: Secondary | ICD-10-CM

## 2023-07-31 IMAGING — DX DG CHEST 2V
2 series · 2 of 2 positions shown · non-contrast
Comparison: None.

CLINICAL DATA: Left arm weakness.

EXAM:
CHEST - 2 VIEW

[chest pa]
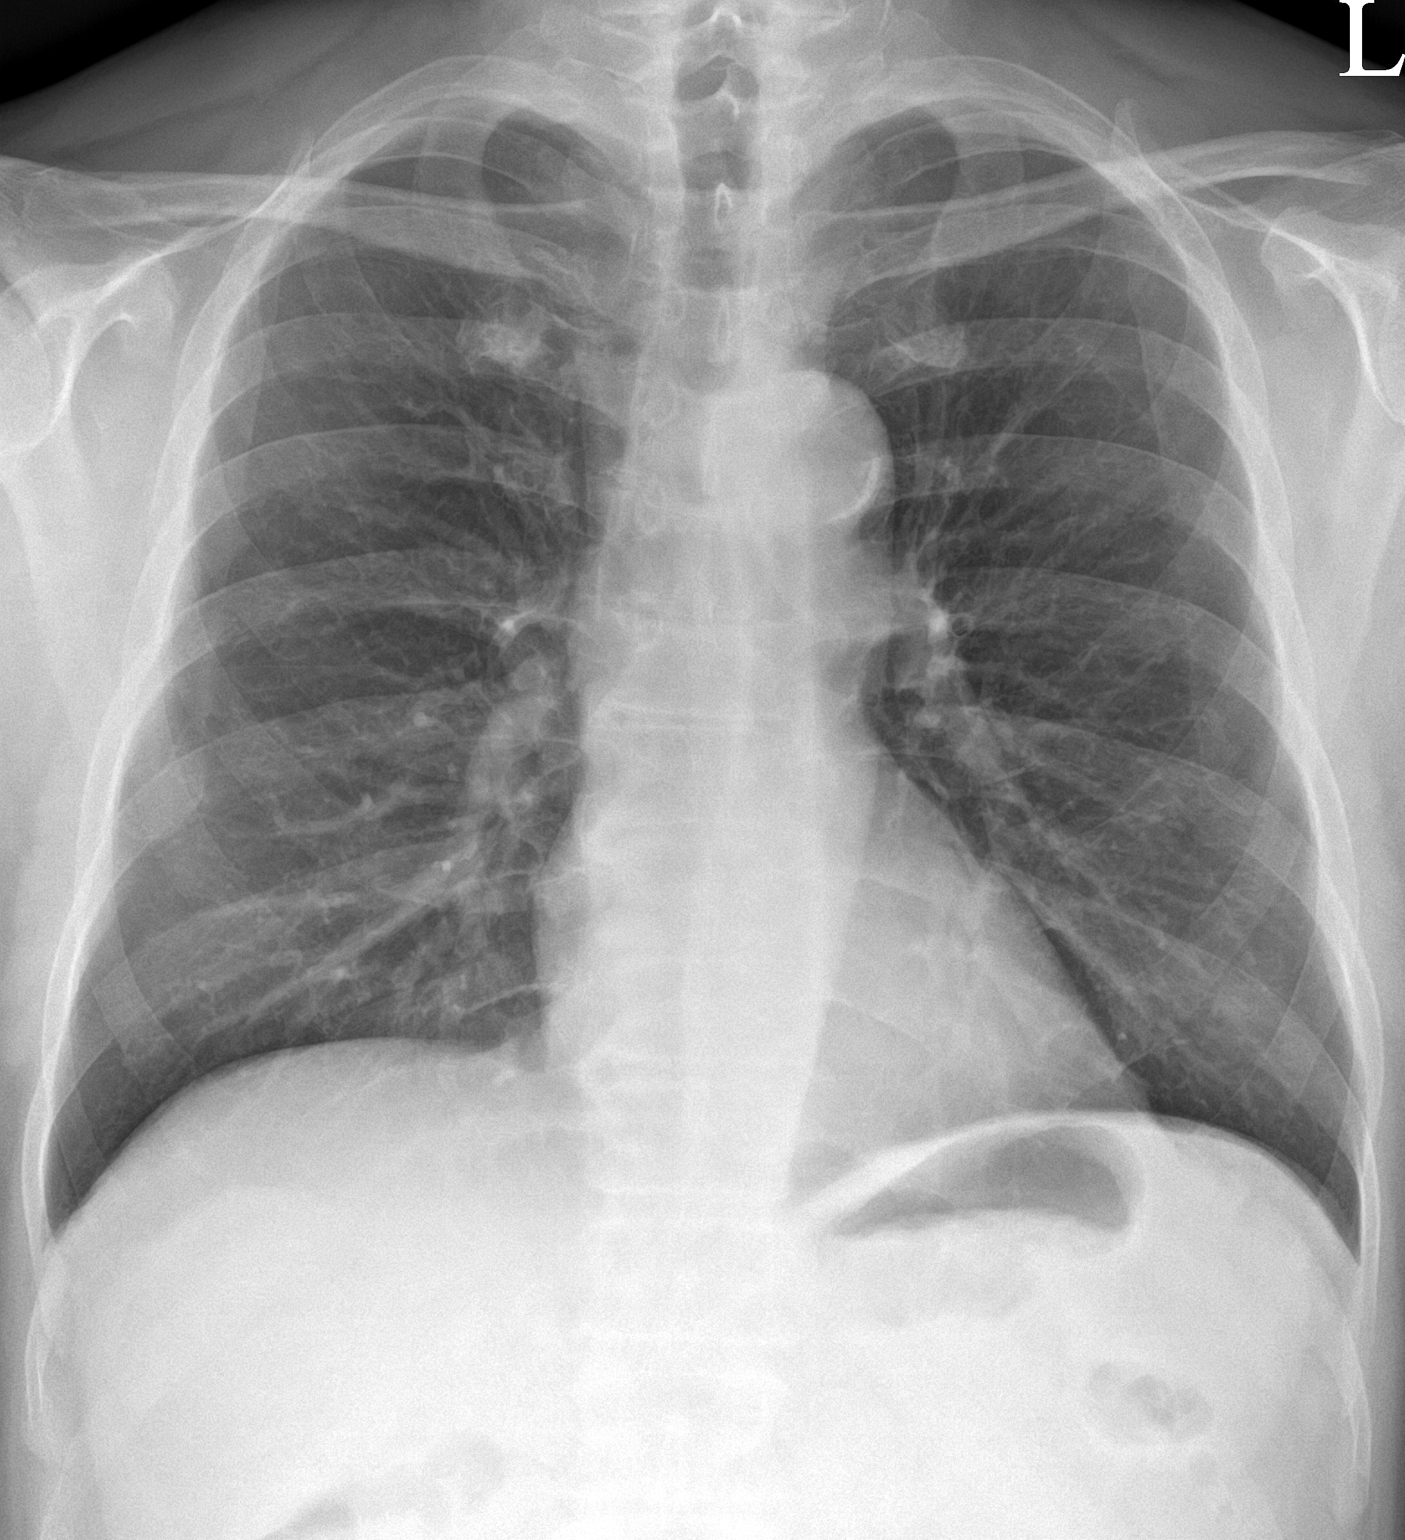

[chest lat]
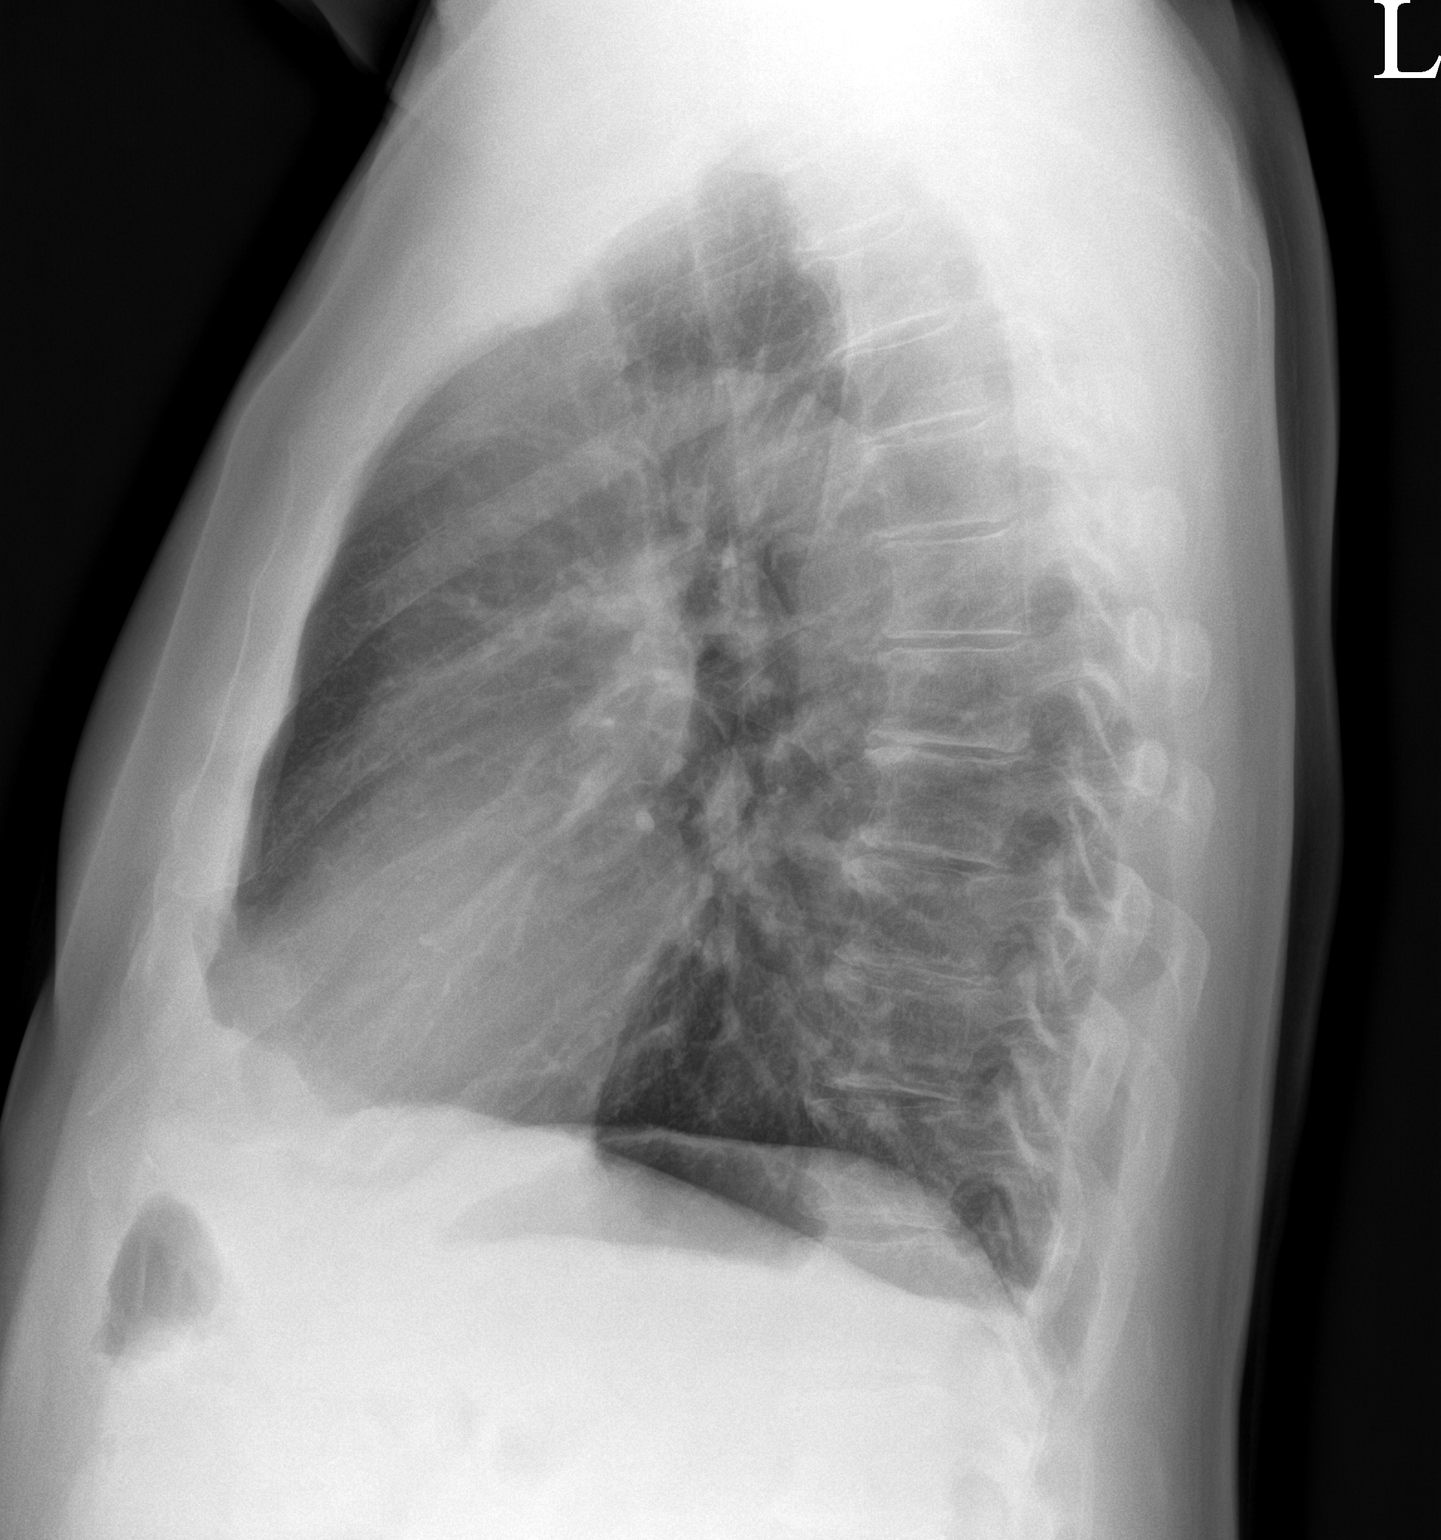

[2 of 2 positions shown; findings below may reference images not displayed]

FINDINGS: Both lungs are clear. Heart and mediastinum are within normal
limits. Trachea is midline. Atherosclerotic calcifications at the
aortic arch. Degenerative endplate changes in the thoracic spine. No
large pleural effusions.
IMPRESSION: No active cardiopulmonary disease.

## 2023-08-10 ENCOUNTER — Other Ambulatory Visit: Payer: Self-pay | Admitting: Emergency Medicine

## 2023-08-10 DIAGNOSIS — I10 Essential (primary) hypertension: Secondary | ICD-10-CM

## 2023-08-13 ENCOUNTER — Other Ambulatory Visit: Payer: Self-pay | Admitting: Emergency Medicine

## 2023-08-13 DIAGNOSIS — N529 Male erectile dysfunction, unspecified: Secondary | ICD-10-CM

## 2023-10-03 ENCOUNTER — Ambulatory Visit: Payer: Medicare Other | Admitting: Emergency Medicine

## 2023-10-22 ENCOUNTER — Ambulatory Visit (INDEPENDENT_AMBULATORY_CARE_PROVIDER_SITE_OTHER): Payer: Medicare Other | Admitting: Emergency Medicine

## 2023-10-22 ENCOUNTER — Encounter: Payer: Self-pay | Admitting: Emergency Medicine

## 2023-10-22 VITALS — BP 118/72 | HR 71 | Temp 98.3°F | Ht 65.0 in | Wt 156.8 lb

## 2023-10-22 DIAGNOSIS — I10 Essential (primary) hypertension: Secondary | ICD-10-CM | POA: Diagnosis not present

## 2023-10-22 DIAGNOSIS — Z1211 Encounter for screening for malignant neoplasm of colon: Secondary | ICD-10-CM

## 2023-10-22 LAB — CBC WITH DIFFERENTIAL/PLATELET
Basophils Absolute: 0 10*3/uL (ref 0.0–0.1)
Basophils Relative: 0.7 % (ref 0.0–3.0)
Eosinophils Absolute: 0 10*3/uL (ref 0.0–0.7)
Eosinophils Relative: 1 % (ref 0.0–5.0)
HCT: 40.8 % (ref 39.0–52.0)
Hemoglobin: 13.7 g/dL (ref 13.0–17.0)
Lymphocytes Relative: 33.6 % (ref 12.0–46.0)
Lymphs Abs: 1.4 10*3/uL (ref 0.7–4.0)
MCHC: 33.5 g/dL (ref 30.0–36.0)
MCV: 103.3 fL — ABNORMAL HIGH (ref 78.0–100.0)
Monocytes Absolute: 0.5 10*3/uL (ref 0.1–1.0)
Monocytes Relative: 11.6 % (ref 3.0–12.0)
Neutro Abs: 2.2 10*3/uL (ref 1.4–7.7)
Neutrophils Relative %: 53.1 % (ref 43.0–77.0)
Platelets: 232 10*3/uL (ref 150.0–400.0)
RBC: 3.95 Mil/uL — ABNORMAL LOW (ref 4.22–5.81)
RDW: 14.1 % (ref 11.5–15.5)
WBC: 4.1 10*3/uL (ref 4.0–10.5)

## 2023-10-22 LAB — LIPID PANEL
Cholesterol: 208 mg/dL — ABNORMAL HIGH (ref 0–200)
HDL: 90.1 mg/dL (ref >39.00)
LDL Cholesterol: 108 mg/dL — ABNORMAL HIGH (ref 0–99)
NonHDL: 118.28
Total CHOL/HDL Ratio: 2
Triglycerides: 50 mg/dL (ref 0.0–149.0)
VLDL: 10 mg/dL (ref 0.0–40.0)

## 2023-10-22 LAB — COMPREHENSIVE METABOLIC PANEL
ALT: 15 U/L (ref 0–53)
AST: 24 U/L (ref 0–37)
Albumin: 4.1 g/dL (ref 3.5–5.2)
Alkaline Phosphatase: 36 U/L — ABNORMAL LOW (ref 39–117)
BUN: 16 mg/dL (ref 6–23)
CO2: 28 meq/L (ref 19–32)
Calcium: 9.2 mg/dL (ref 8.4–10.5)
Chloride: 102 meq/L (ref 96–112)
Creatinine, Ser: 0.93 mg/dL (ref 0.40–1.50)
GFR: 82.24 mL/min (ref >60.00)
Glucose, Bld: 91 mg/dL (ref 70–99)
Potassium: 4.5 meq/L (ref 3.5–5.1)
Sodium: 140 meq/L (ref 135–145)
Total Bilirubin: 0.8 mg/dL (ref 0.2–1.2)
Total Protein: 7 g/dL (ref 6.0–8.3)

## 2023-10-22 NOTE — Assessment & Plan Note (Signed)
BP Readings from Last 3 Encounters:  10/22/23 118/72  10/02/22 136/84  02/20/22 (!) 153/90  Well-controlled hypertension Continue lisinopril 20 mg daily Cardiovascular risks associated with hypertension discussed Dietary approaches to stop hypertension discussed

## 2023-10-22 NOTE — Progress Notes (Signed)
Daniel Lamb 72 y.o.   Chief Complaint  Patient presents with   Annual Exam    1 year f/u. No other concerns    HISTORY OF PRESENT ILLNESS: This is a 72 y.o. male here for follow-up of chronic medical conditions including hypertension Recently had bout of gastroenteritis followed by bronchitis.  Recovered well. Has no complaints or medical concerns today. BP Readings from Last 3 Encounters:  10/22/23 118/72  10/02/22 136/84  02/20/22 (!) 153/90     HPI   Prior to Admission medications   Medication Sig Start Date End Date Taking? Authorizing Provider  clotrimazole-betamethasone (LOTRISONE) cream Apply 1 application topically 2 (two) times daily. Patient taking differently: Apply 1 application  topically as needed. 03/30/21  Yes Trevonn Hallum, Eilleen Kempf, MD  lisinopril (ZESTRIL) 20 MG tablet TAKE 1 TABLET BY MOUTH EVERY DAY 08/10/23  Yes Shonda Mandarino, Eilleen Kempf, MD  sildenafil (REVATIO) 20 MG tablet TAKE 1 TO 2 TABLETS BY MOUTH DAILY 1 HOUR PRIOR TO SEXUAL ACTIVITY FOR MAXIMAL EFFECTIVENESS 08/13/23  Yes Janeisha Ryle, Eilleen Kempf, MD  valACYclovir (VALTREX) 1000 MG tablet Take 1 tablet (1,000 mg total) by mouth 2 (two) times daily. Patient taking differently: Take 1,000 mg by mouth as needed. 11/04/20  Yes Peyton Najjar, MD    No Known Allergies  Patient Active Problem List   Diagnosis Date Noted   Median neuropathy at upper arm, left 02/14/2022   Dupuytren's contracture of left hand 05/18/2021   Osteoarthritis of both hands 03/30/2021   Essential hypertension 07/09/2018   Erectile dysfunction 07/09/2018   History of herpes genitalis 06/28/2015    Past Medical History:  Diagnosis Date   Genital herpes    Hypertension     Past Surgical History:  Procedure Laterality Date   VASECTOMY      Social History   Socioeconomic History   Marital status: Married    Spouse name: Not on file   Number of children: Not on file   Years of education: Not on file   Highest education  level: Not on file  Occupational History   Not on file  Tobacco Use   Smoking status: Never   Smokeless tobacco: Never  Vaping Use   Vaping status: Never Used  Substance and Sexual Activity   Alcohol use: Yes   Drug use: No   Sexual activity: Not on file  Other Topics Concern   Not on file  Social History Narrative   Right handed    Social Determinants of Health   Financial Resource Strain: Not on file  Food Insecurity: Not on file  Transportation Needs: Not on file  Physical Activity: Not on file  Stress: Not on file  Social Connections: Unknown (04/18/2022)   Received from [facility]   Social Network    Social Network: Not on file  Intimate Partner Violence: Unknown (03/10/2022)   Received from [facility]   HITS    Physically Hurt: Not on file    Insult or Talk Down To: Not on file    Threaten Physical Harm: Not on file    Scream or Curse: Not on file    Family History  Problem Relation Age of Onset   Cancer Mother    Cancer Father    COPD Brother    Lung cancer Brother      Review of Systems  Constitutional: Negative.  Negative for chills and fever.  HENT: Negative.  Negative for congestion and sore throat.  Respiratory: Negative.  Negative for cough and shortness of breath.   Cardiovascular: Negative.  Negative for chest pain and palpitations.  Gastrointestinal:  Negative for abdominal pain, diarrhea, nausea and vomiting.  Genitourinary: Negative.  Negative for dysuria and hematuria.  Skin: Negative.  Negative for rash.  Neurological: Negative.  Negative for dizziness and headaches.  All other systems reviewed and are negative.   Vitals:   10/22/23 1017  BP: 118/72  Pulse: 71  Temp: 98.3 F (36.8 C)  SpO2: 96%    Physical Exam Vitals reviewed.  Constitutional:      Appearance: Normal appearance.  HENT:     Head: Normocephalic.     Right Ear: Tympanic membrane, ear canal and external ear normal.     Left  Ear: Tympanic membrane, ear canal and external ear normal.     Mouth/Throat:     Mouth: Mucous membranes are moist.     Pharynx: Oropharynx is clear.  Eyes:     Extraocular Movements: Extraocular movements intact.     Pupils: Pupils are equal, round, and reactive to light.  Cardiovascular:     Rate and Rhythm: Normal rate and regular rhythm.     Pulses: Normal pulses.     Heart sounds: Normal heart sounds.  Pulmonary:     Effort: Pulmonary effort is normal.     Breath sounds: Normal breath sounds.  Abdominal:     Palpations: Abdomen is soft.     Tenderness: There is no abdominal tenderness.  Musculoskeletal:     Cervical back: No tenderness.  Lymphadenopathy:     Cervical: No cervical adenopathy.  Skin:    General: Skin is warm and dry.     Capillary Refill: Capillary refill takes less than 2 seconds.  Neurological:     General: No focal deficit present.     Mental Status: He is alert and oriented to person, place, and time.  Psychiatric:        Mood and Affect: Mood normal.        Behavior: Behavior normal.      ASSESSMENT & PLAN: A total of 32 minutes was spent with the patient and counseling/coordination of care regarding preparing for this visit, review of most recent office visit notes, diagnosis of hypertension and cardiovascular risks associated with this condition, review of all medications, education on nutrition, review of health maintenance items and need for colon cancer screening, prognosis, documentation, and need for follow-up.  Problem List Items Addressed This Visit       Cardiovascular and Mediastinum   Essential hypertension - Primary    BP Readings from Last 3 Encounters:  10/22/23 118/72  10/02/22 136/84  02/20/22 (!) 153/90  Well-controlled hypertension Continue lisinopril 20 mg daily Cardiovascular risks associated with hypertension discussed Dietary approaches to stop hypertension discussed       Relevant Orders   CBC with  Differential/Platelet   Comprehensive metabolic panel   Lipid panel   Other Visit Diagnoses     Screening for colon cancer       Relevant Orders   Ambulatory referral to Gastroenterology      Patient Instructions  Health Maintenance After Age 4 After age 3, you are at a higher risk for certain long-term diseases and infections as well as injuries from falls. Falls are a major cause of broken bones and head injuries in people who are older than age 80. Getting regular preventive care can help to keep you healthy and well. Preventive care includes getting  regular testing and making lifestyle changes as recommended by your health care provider. Talk with your health care provider about: Which screenings and tests you should have. A screening is a test that checks for a disease when you have no symptoms. A diet and exercise plan that is right for you. What should I know about screenings and tests to prevent falls? Screening and testing are the best ways to find a health problem early. Early diagnosis and treatment give you the best chance of managing medical conditions that are common after age 28. Certain conditions and lifestyle choices may make you more likely to have a fall. Your health care provider may recommend: Regular vision checks. Poor vision and conditions such as cataracts can make you more likely to have a fall. If you wear glasses, make sure to get your prescription updated if your vision changes. Medicine review. Work with your health care provider to regularly review all of the medicines you are taking, including over-the-counter medicines. Ask your health care provider about any side effects that may make you more likely to have a fall. Tell your health care provider if any medicines that you take make you feel dizzy or sleepy. Strength and balance checks. Your health care provider may recommend certain tests to check your strength and balance while standing, walking, or changing  positions. Foot health exam. Foot pain and numbness, as well as not wearing proper footwear, can make you more likely to have a fall. Screenings, including: Osteoporosis screening. Osteoporosis is a condition that causes the bones to get weaker and break more easily. Blood pressure screening. Blood pressure changes and medicines to control blood pressure can make you feel dizzy. Depression screening. You may be more likely to have a fall if you have a fear of falling, feel depressed, or feel unable to do activities that you used to do. Alcohol use screening. Using too much alcohol can affect your balance and may make you more likely to have a fall. Follow these instructions at home: Lifestyle Do not drink alcohol if: Your health care provider tells you not to drink. If you drink alcohol: Limit how much you have to: 0-1 drink a day for women. 0-2 drinks a day for men. Know how much alcohol is in your drink. In the [place] one drink equals one 12 oz bottle of beer (355 mL), one 5 oz glass of wine (148 mL), or one 1 oz glass of hard liquor (44 mL). Do not use any products that contain nicotine or tobacco. These products include cigarettes, chewing tobacco, and vaping devices, such as e-cigarettes. If you need help quitting, ask your health care provider. Activity  Follow a regular exercise program to stay fit. This will help you maintain your balance. Ask your health care provider what types of exercise are appropriate for you. If you need a cane or walker, use it as recommended by your health care provider. Wear supportive shoes that have nonskid soles. Safety  Remove any tripping hazards, such as rugs, cords, and clutter. Install safety equipment such as grab bars in bathrooms and safety rails on stairs. Keep rooms and walkways well-lit. General instructions Talk with your health care provider about your risks for falling. Tell your health care provider if: You fall. Be sure to tell your  health care provider about all falls, even ones that seem minor. You feel dizzy, tiredness (fatigue), or off-balance. Take over-the-counter and prescription medicines only as told by your health care provider. These include  supplements. Eat a healthy diet and maintain a healthy weight. A healthy diet includes low-fat dairy products, low-fat (lean) meats, and fiber from whole grains, beans, and lots of fruits and vegetables. Stay current with your vaccines. Schedule regular health, dental, and eye exams. Summary Having a healthy lifestyle and getting preventive care can help to protect your health and wellness after age 54. Screening and testing are the best way to find a health problem early and help you avoid having a fall. Early diagnosis and treatment give you the best chance for managing medical conditions that are more common for people who are older than age 73. Falls are a major cause of broken bones and head injuries in people who are older than age 17. Take precautions to prevent a fall at home. Work with your health care provider to learn what changes you can make to improve your health and wellness and to prevent falls. This information is not intended to replace advice given to you by your health care provider. Make sure you discuss any questions you have with your health care provider. Document Revised: 04/11/2021 Document Reviewed: 04/11/2021 Elsevier Patient Education  2024 Elsevier Inc.    Edwina Barth, MD [facility]

## 2023-10-22 NOTE — Patient Instructions (Signed)
Health Maintenance After Age 72 After age 72, you are at a higher risk for certain long-term diseases and infections as well as injuries from falls. Falls are a major cause of broken bones and head injuries in people who are older than age 72. Getting regular preventive care can help to keep you healthy and well. Preventive care includes getting regular testing and making lifestyle changes as recommended by your health care provider. Talk with your health care provider about: Which screenings and tests you should have. A screening is a test that checks for a disease when you have no symptoms. A diet and exercise plan that is right for you. What should I know about screenings and tests to prevent falls? Screening and testing are the best ways to find a health problem early. Early diagnosis and treatment give you the best chance of managing medical conditions that are common after age 72. Certain conditions and lifestyle choices may make you more likely to have a fall. Your health care provider may recommend: Regular vision checks. Poor vision and conditions such as cataracts can make you more likely to have a fall. If you wear glasses, make sure to get your prescription updated if your vision changes. Medicine review. Work with your health care provider to regularly review all of the medicines you are taking, including over-the-counter medicines. Ask your health care provider about any side effects that may make you more likely to have a fall. Tell your health care provider if any medicines that you take make you feel dizzy or sleepy. Strength and balance checks. Your health care provider may recommend certain tests to check your strength and balance while standing, walking, or changing positions. Foot health exam. Foot pain and numbness, as well as not wearing proper footwear, can make you more likely to have a fall. Screenings, including: Osteoporosis screening. Osteoporosis is a condition that causes  the bones to get weaker and break more easily. Blood pressure screening. Blood pressure changes and medicines to control blood pressure can make you feel dizzy. Depression screening. You may be more likely to have a fall if you have a fear of falling, feel depressed, or feel unable to do activities that you used to do. Alcohol use screening. Using too much alcohol can affect your balance and may make you more likely to have a fall. Follow these instructions at home: Lifestyle Do not drink alcohol if: Your health care provider tells you not to drink. If you drink alcohol: Limit how much you have to: 0-1 drink a day for women. 0-2 drinks a day for men. Know how much alcohol is in your drink. In the [place] one drink equals one 12 oz bottle of beer (355 mL), one 5 oz glass of wine (148 mL), or one 1 oz glass of hard liquor (44 mL). Do not use any products that contain nicotine or tobacco. These products include cigarettes, chewing tobacco, and vaping devices, such as e-cigarettes. If you need help quitting, ask your health care provider. Activity  Follow a regular exercise program to stay fit. This will help you maintain your balance. Ask your health care provider what types of exercise are appropriate for you. If you need a cane or walker, use it as recommended by your health care provider. Wear supportive shoes that have nonskid soles. Safety  Remove any tripping hazards, such as rugs, cords, and clutter. Install safety equipment such as grab bars in bathrooms and safety rails on stairs. Keep rooms and walkways   well-lit. General instructions Talk with your health care provider about your risks for falling. Tell your health care provider if: You fall. Be sure to tell your health care provider about all falls, even ones that seem minor. You feel dizzy, tiredness (fatigue), or off-balance. Take over-the-counter and prescription medicines only as told by your health care provider. These include  supplements. Eat a healthy diet and maintain a healthy weight. A healthy diet includes low-fat dairy products, low-fat (lean) meats, and fiber from whole grains, beans, and lots of fruits and vegetables. Stay current with your vaccines. Schedule regular health, dental, and eye exams. Summary Having a healthy lifestyle and getting preventive care can help to protect your health and wellness after age 72. Screening and testing are the best way to find a health problem early and help you avoid having a fall. Early diagnosis and treatment give you the best chance for managing medical conditions that are more common for people who are older than age 72. Falls are a major cause of broken bones and head injuries in people who are older than age 72. Take precautions to prevent a fall at home. Work with your health care provider to learn what changes you can make to improve your health and wellness and to prevent falls. This information is not intended to replace advice given to you by your health care provider. Make sure you discuss any questions you have with your health care provider. Document Revised: 04/11/2021 Document Reviewed: 04/11/2021 Elsevier Patient Education  2024 Elsevier Inc.  

## 2023-11-19 ENCOUNTER — Other Ambulatory Visit: Payer: Self-pay | Admitting: Emergency Medicine

## 2023-11-19 DIAGNOSIS — I10 Essential (primary) hypertension: Secondary | ICD-10-CM

## 2023-12-13 ENCOUNTER — Other Ambulatory Visit: Payer: Self-pay | Admitting: Emergency Medicine

## 2023-12-13 DIAGNOSIS — Z8619 Personal history of other infectious and parasitic diseases: Secondary | ICD-10-CM

## 2023-12-13 NOTE — Telephone Encounter (Signed)
 Copied from CRM [id number]. Topic: Clinical - Medication Refill >> Dec 13, 2023  1:49 PM Isabell A wrote: Most Recent Primary Care Visit:  Provider: PURCELL EMIL SCHANZ  Department: [facility]  Visit Type: OFFICE VISIT  Date: 10/22/2023  Medication: ***  Has the patient contacted their pharmacy?  (Agent: If no, request that the patient contact the pharmacy for the refill. If patient does not wish to contact the pharmacy document the reason why and proceed with request.) (Agent: If yes, when and what did the pharmacy advise?)  Is this the correct pharmacy for this prescription?  If no, delete pharmacy and type the correct one.  This is the patient's preferred pharmacy:  [id number] - [place], [place] - [address] [place] [address] Phone: [phone] Fax: [phone]  [id number] - [place], [place] - [address] [place] [address] Phone: [phone] Fax: [phone]   Has the prescription been filled recently?   Is the patient out of the medication?   Has the patient been seen for an appointment in the last year OR does the patient have an upcoming appointment?   Can we respond through MyChart?   Agent: Please be advised that Rx refills may take up to 3 business days. We ask that you follow-up with your pharmacy.

## 2023-12-14 NOTE — Telephone Encounter (Signed)
 Last seen 10/22/2023, Rx last written 2021

## 2023-12-17 ENCOUNTER — Encounter: Payer: Self-pay | Admitting: Internal Medicine

## 2023-12-17 ENCOUNTER — Telehealth: Payer: Self-pay | Admitting: Emergency Medicine

## 2023-12-17 ENCOUNTER — Other Ambulatory Visit: Payer: Self-pay | Admitting: Radiology

## 2023-12-17 DIAGNOSIS — Z8619 Personal history of other infectious and parasitic diseases: Secondary | ICD-10-CM

## 2023-12-17 MED ORDER — VALACYCLOVIR HCL 1 G PO TABS: 1000.0000 mg | ORAL_TABLET | ORAL | 1 refills | Status: AC | PRN

## 2023-12-17 NOTE — Telephone Encounter (Signed)
 Copied from CRM [id number]. Topic: Clinical - Medication Refill >> Dec 13, 2023  1:49 PM Isabell A wrote: Most Recent Primary Care Visit:  Provider: PURCELL EMIL SCHANZ  Department: [facility]  Visit Type: OFFICE VISIT  Date: 10/22/2023  Medication: valACYclovir  (VALTREX ) 1000 MG tablet  Has the patient contacted their pharmacy? Yes (Agent: If no, request that the patient contact the pharmacy for the refill. If patient does not wish to contact the pharmacy document the reason why and proceed with request.) (Agent: If yes, when and what did the pharmacy advise?) Pharmacy doesn't have the prescription on record.   Is this the correct pharmacy for this prescription? Yes If no, delete pharmacy and type the correct one.  This is the patient's preferred pharmacy:  [id number] [place], [place] - [address] [place] [address] Phone: [phone] Fax: [phone]   Has the prescription been filled recently? No  Is the patient out of the medication? Yes  Has the patient been seen for an appointment in the last year OR does the patient have an upcoming appointment? Yes  Can we respond through MyChart? No  Agent: Please be advised that Rx refills may take up to 3 business days. We ask that you follow-up with your pharmacy.  ---  Appears medication was last done by Dr. Alm Areola in 2021. Please advise.

## 2024-01-04 ENCOUNTER — Ambulatory Visit (AMBULATORY_SURGERY_CENTER): Payer: Medicare Other | Admitting: *Deleted

## 2024-01-04 VITALS — Ht 65.0 in | Wt 156.0 lb

## 2024-01-04 DIAGNOSIS — Z1211 Encounter for screening for malignant neoplasm of colon: Secondary | ICD-10-CM

## 2024-01-04 MED ORDER — SUFLAVE 178.7 G PO SOLR: 1.0000 | Freq: Once | ORAL | 0 refills | Status: AC

## 2024-01-04 NOTE — Progress Notes (Signed)
Pt's name and DOB verified at the beginning of the pre-visit wit 2 identifiers  Pt denies any difficulty with ambulating,sitting, laying down or rolling side to side  Pt has no issues with ambulation   Pt has no issues moving head neck or swallowing  No egg or soy allergy known to patient   Patient denies ever being intubated  No FH of Malignant Hyperthermia  Pt is not on diet pills or shots  Pt is not on home 02   Pt is not on blood thinners   Pt denies issues with constipation   Pt is not on dialysis  Pt denise any abnormal heart rhythms   Pt denies any upcoming cardiac testing  Pt encouraged to use to use Singlecare or Goodrx to reduce cost   Patient's chart reviewed by Cathlyn Parsons CNRA prior to pre-visit and patient appropriate for the [facility].  Pre-visit completed and red dot placed by patient's name on their procedure day (on provider's schedule).  .  Visit by phone  Pt states weight is 156 lb  Instructed pt why it is important to and  to call if they have any changes in health or new medications. Directed them to the # given and on instructions.     Instructions reviewed. Pt given both [facility] main # and MD on call # and [employer] # prior to instructions.  Pt states understanding. Instructed to review again prior to procedure. Pt states they will.   Informed pt that they will receive a text and call from [facility] regarding there prep med.

## 2024-01-04 NOTE — Addendum Note (Signed)
Addended by: Danton Sewer on: 01/04/2024 04:51 PM   Modules accepted: Orders

## 2024-01-24 ENCOUNTER — Telehealth: Payer: Self-pay

## 2024-01-24 NOTE — Telephone Encounter (Signed)
Patient is scheduled for a colonoscopy 01/25/24. He took naproxen 400 mg on 01/22/24. Do you want him rescheduled?

## 2024-01-24 NOTE — Telephone Encounter (Signed)
 Patient notified

## 2024-01-24 NOTE — Telephone Encounter (Signed)
No. Keep plans for colonoscopy as scheduled. Thanks for checking. Dr.

## 2024-01-25 ENCOUNTER — Telehealth: Payer: Self-pay

## 2024-01-25 ENCOUNTER — Encounter: Payer: Self-pay | Admitting: Internal Medicine

## 2024-01-25 ENCOUNTER — Ambulatory Visit: Payer: Medicare Other | Admitting: Internal Medicine

## 2024-01-25 VITALS — BP 127/86 | HR 63 | Temp 97.7°F | Resp 15 | Ht 65.0 in | Wt 156.0 lb

## 2024-01-25 DIAGNOSIS — K648 Other hemorrhoids: Secondary | ICD-10-CM

## 2024-01-25 DIAGNOSIS — K5520 Angiodysplasia of colon without hemorrhage: Secondary | ICD-10-CM | POA: Diagnosis not present

## 2024-01-25 DIAGNOSIS — D120 Benign neoplasm of cecum: Secondary | ICD-10-CM | POA: Diagnosis not present

## 2024-01-25 DIAGNOSIS — K5730 Diverticulosis of large intestine without perforation or abscess without bleeding: Secondary | ICD-10-CM

## 2024-01-25 DIAGNOSIS — Z1211 Encounter for screening for malignant neoplasm of colon: Secondary | ICD-10-CM

## 2024-01-25 MED ORDER — SODIUM CHLORIDE 0.9 % IV SOLN: 500.0000 mL | Freq: Once | INTRAVENOUS | Status: DC

## 2024-01-25 NOTE — Op Note (Signed)
[facility] Patient Name: Daniel Lamb Procedure Date: 01/25/2024 1:45 PM MRN: [id number] Endoscopist: Wilhemina Bonito. Marina Goodell , MD, [id number] Age: 73 Referring MD:  Date of Birth: [birth year] Gender: Male Account #: [id number] Procedure:                Colonoscopy with cold snare polypectomy x 1 Indications:              Screening for colorectal malignant neoplasm Medicines:                Monitored Anesthesia Care Procedure:                Pre-Anesthesia Assessment:                           - Prior to the procedure, a History and Physical                            was performed, and patient medications and                            allergies were reviewed. The patient's tolerance of                            previous anesthesia was also reviewed. The risks                            and benefits of the procedure and the sedation                            options and risks were discussed with the patient.                            All questions were answered, and informed consent                            was obtained. Prior Anticoagulants: The patient has                            taken no anticoagulant or antiplatelet agents. ASA                            Grade Assessment: II - A patient with mild systemic                            disease. After reviewing the risks and benefits,                            the patient was deemed in satisfactory condition to                            undergo the procedure.                           After obtaining informed consent, the colonoscope  was passed under direct vision. Throughout the                            procedure, the patient's blood pressure, pulse, and                            oxygen saturations were monitored continuously. The                            CF [id number] #[id number] was introduced through the anus                            and advanced to the the cecum, identified by                             appendiceal orifice and ileocecal valve. The                            ileocecal valve, appendiceal orifice, and rectum                            were photographed. The quality of the bowel                            preparation was excellent. The colonoscopy was                            performed without difficulty. The patient tolerated                            the procedure well. The bowel preparation used was                            SUPREP via split dose instruction. Scope In: 1:56:47 PM Scope Out: 2:07:52 PM Scope Withdrawal Time: 0 hours 9 minutes 41 seconds  Total Procedure Duration: 0 hours 11 minutes 5 seconds  Findings:                 A 5 mm polyp was found in the cecum. The polyp was                            sessile. The polyp was removed with a cold snare.                            Resection and retrieval were complete.                           A single angiodysplastic lesion was found in the                            cecum.                           Multiple diverticula were found in the sigmoid  colon.                           Internal hemorrhoids were found during retroflexion.                           The exam was otherwise without abnormality on                            direct and retroflexion views. Complications:            No immediate complications. Estimated blood loss:                            None. Estimated Blood Loss:     Estimated blood loss: none. Impression:               - One 5 mm polyp in the cecum, removed with a cold                            snare. Resected and retrieved.                           - A single colonic angiodysplastic lesion.                           - Diverticulosis in the sigmoid colon.                           - Internal hemorrhoids.                           - The examination was otherwise normal on direct                            and retroflexion views. Recommendation:            - Repeat colonoscopy is not recommended for                            surveillance.                           - Patient has a contact number available for                            emergencies. The signs and symptoms of potential                            delayed complications were discussed with the                            patient. Return to normal activities tomorrow.                            Written discharge instructions were provided to the  patient.                           - Resume previous diet.                           - Continue present medications.                           - Await pathology results. Wilhemina Bonito. Marina Goodell, MD 01/25/2024 2:15:10 PM This report has been signed electronically.

## 2024-01-25 NOTE — Progress Notes (Signed)
 Report to PACU, RN, vss, BBS= Clear.

## 2024-01-25 NOTE — Progress Notes (Signed)
 Called to room to assist during endoscopic procedure.  Patient ID and intended procedure confirmed with present staff. Received instructions for my participation in the procedure from the performing physician.

## 2024-01-25 NOTE — Progress Notes (Signed)
 Pt's states no medical or surgical changes since previsit or office visit.

## 2024-01-25 NOTE — Progress Notes (Signed)
HISTORY OF PRESENT ILLNESS:  Daniel Lamb is a 73 y.o. male sent today for screening colonoscopy  REVIEW OF SYSTEMS:  All non-GI ROS negative except for  Past Medical History:  Diagnosis Date   Arthritis    R kneee   Genital herpes    GERD (gastroesophageal reflux disease)    Hypertension     Past Surgical History:  Procedure Laterality Date   VASECTOMY      Social History Daniel Lamb  reports that he has never smoked. He has never used smokeless tobacco. He reports current alcohol use. He reports that he does not use drugs.  family history includes Breast cancer in his mother; COPD in his brother; Cancer in his father and mother; Lung cancer in his brother.  No Known Allergies     PHYSICAL EXAMINATION: Vital signs: BP (!) 155/90   Pulse 80   Temp 97.7 F (36.5 C)   Resp 12   Ht 5\' 5"  (1.651 m)   Wt 156 lb (70.8 kg)   SpO2 99%   BMI 25.96 kg/m  General: Well-developed, well-nourished, no acute distress HEENT: Sclerae are anicteric, conjunctiva pink. Oral mucosa intact Lungs: Clear Heart: Regular Abdomen: soft, nontender, nondistended, no obvious ascites, no peritoneal signs, normal bowel sounds. No organomegaly. Extremities: No edema Psychiatric: alert and oriented x3. Cooperative     ASSESSMENT:  Colon cancer screening   PLAN:  Screening colonoscopy

## 2024-01-25 NOTE — Telephone Encounter (Signed)
Patient transferred to admitting for consultation. States he last had something to drink at 11:30 am and wanted to make sure it was OK not to have anything more to drink before arriving for his appointment. I confirmed that, according to his prep instructions, he was in fact to stop drinking at 11:30 am and to please continue to follow his instructions as stated. Patient verbalizes understanding.

## 2024-01-25 NOTE — Patient Instructions (Signed)

## 2024-01-28 ENCOUNTER — Telehealth: Payer: Self-pay

## 2024-01-28 NOTE — Telephone Encounter (Signed)
  Follow up Call-     01/25/2024    1:32 PM  Call back number  Post procedure Call Back phone  # [phone]  Permission to leave phone message Yes     Patient questions:  Do you have a fever, pain , or abdominal swelling? No. Pain Score  0 *  Have you tolerated food without any problems? Yes.    Have you been able to return to your normal activities? Yes.    Do you have any questions about your discharge instructions: Diet   No. Medications  No. Follow up visit  No.  Do you have questions or concerns about your Care? No.  Actions: * If pain score is 4 or above: No action needed, pain <4.

## 2024-01-30 LAB — SURGICAL PATHOLOGY

## 2024-01-31 ENCOUNTER — Encounter: Payer: Self-pay | Admitting: Internal Medicine

## 2024-08-05 ENCOUNTER — Ambulatory Visit: Payer: PRIVATE HEALTH INSURANCE | Admitting: Emergency Medicine

## 2024-09-22 ENCOUNTER — Telehealth: Payer: Self-pay | Admitting: Emergency Medicine

## 2024-09-22 NOTE — Telephone Encounter (Unsigned)
 Copied from CRM [id number]. Topic: Clinical - Medication Refill >> Sep 22, 2024 11:55 AM Alfonso HERO wrote: Medication: sildenafil  (REVATIO ) 20 MG tablet   Has the patient contacted their pharmacy? No (Agent: If no, request that the patient contact the pharmacy for the refill. If patient does not wish to contact the pharmacy document the reason why and proceed with request.) (Agent: If yes, when and what did the pharmacy advise?)  This is the patient's preferred pharmacy:  [facility] [id number] - [place], [place] - [address] [place] [address] Phone: [phone] Fax: [phone]   Is this the correct pharmacy for this prescription? Yes If no, delete pharmacy and type the correct one.   Has the prescription been filled recently? Yes  Is the patient out of the medication? Yes  Has the patient been seen for an appointment in the last year OR does the patient have an upcoming appointment? Yes  Can we respond through MyChart? Yes  Agent: Please be advised that Rx refills may take up to 3 business days. We ask that you follow-up with your pharmacy.

## 2024-10-01 ENCOUNTER — Other Ambulatory Visit: Payer: Self-pay | Admitting: Emergency Medicine

## 2024-10-01 DIAGNOSIS — N529 Male erectile dysfunction, unspecified: Secondary | ICD-10-CM

## 2024-11-21 ENCOUNTER — Ambulatory Visit: Payer: PRIVATE HEALTH INSURANCE

## 2024-11-21 VITALS — BP 132/78 | HR 78 | Ht 64.0 in | Wt 160.0 lb

## 2024-11-21 DIAGNOSIS — H9190 Unspecified hearing loss, unspecified ear: Secondary | ICD-10-CM | POA: Diagnosis not present

## 2024-11-21 DIAGNOSIS — Z0000 Encounter for general adult medical examination without abnormal findings: Secondary | ICD-10-CM

## 2024-11-21 NOTE — Patient Instructions (Addendum)
 Daniel Lamb,  Thank you for taking the time for your Medicare Wellness Visit. I appreciate your continued commitment to your health goals. Please review the care plan we discussed, and feel free to reach out if I can assist you further.  Please note that Annual Wellness Visits do not include a physical exam. Some assessments may be limited, especially if the visit was conducted virtually. If needed, we may recommend an in-person follow-up with your provider.  Ongoing Care Seeing your primary care provider every 3 to 6 months helps us  monitor your health and provide consistent, personalized care.   Referrals If a referral was made during today's visit and you haven't received any updates within two weeks, please contact the referred provider directly to check on the status.  Recommended Screenings:  Health Maintenance  Topic Date Due   Zoster (Shingles) Vaccine (2 of 2) 11/05/2017   Flu Shot  07/04/2024   COVID-19 Vaccine (5 - 2025-26 season) 08/04/2024   Medicare Annual Wellness Visit  11/21/2025   DTaP/Tdap/Td vaccine (2 - Td or Tdap) 08/29/2029   Pneumococcal Vaccine for age over 80  Completed   Hepatitis C Screening  Completed   Meningitis B Vaccine  Aged Out   Colon Cancer Screening  Discontinued       02/20/2022   11:04 AM  Advanced Directives  Does Patient Have a Medical Advance Directive? Yes    Vision: Annual vision screenings are recommended for early detection of glaucoma, cataracts, and diabetic retinopathy. These exams can also reveal signs of chronic conditions such as diabetes and high blood pressure.  Dental: Annual dental screenings help detect early signs of oral cancer, gum disease, and other conditions linked to overall health, including heart disease and diabetes.

## 2024-11-21 NOTE — Progress Notes (Signed)
 "  Chief Complaint  Patient presents with   Medicare Wellness     Subjective:  Please attest and cosign this visit due to patients primary care provider not being in the office at the time the visit was completed.  (Pt of Dr CHRISTELLA. Sagardia)   Daniel Lamb is a 73 y.o. male who presents for a Medicare Annual Wellness Visit.  Visit info / Clinical Intake: Medicare Wellness Visit Type:: Initial Annual Wellness Visit Persons participating in visit and providing information:: patient Medicare Wellness Visit Mode:: In-person (required for WTM) Interpreter Needed?: No Pre-visit prep was completed: yes AWV questionnaire completed by patient prior to visit?: yes Date:: 11/21/24 Living arrangements:: lives with spouse/significant other Patient's Overall Health Status Rating: good Typical amount of pain: none Does pain affect daily life?: no Are you currently prescribed opioids?: no  Dietary Habits and Nutritional Risks How many meals a day?: 2 (2-3) Eats fruit and vegetables daily?: (!) no Most meals are obtained by: preparing own meals; eating out In the last 2 weeks, have you had any of the following?: none Diabetic:: no  Functional Status Activities of Daily Living (to include ambulation/medication): Independent Ambulation: Independent with device- listed below Home Assistive Devices/Equipment: Eyeglasses Medication Administration: Independent Home Management (perform basic housework or laundry): Independent Manage your own finances?: yes Primary transportation is: driving Concerns about vision?: no *vision screening is required for WTM* Concerns about hearing?: no  Fall Screening Falls in the past year?: 0 Number of falls in past year: 0 Was there an injury with Fall?: 0 Fall Risk Category Calculator: 0 Patient Fall Risk Level: Low Fall Risk  Fall Risk Patient at Risk for Falls Due to: No Fall Risks Fall risk Follow up: Falls evaluation completed; Falls prevention  discussed  Home and Transportation Safety: All rugs have non-skid backing?: yes All stairs or steps have railings?: yes Grab bars in the bathtub or shower?: yes Have non-skid surface in bathtub or shower?: yes Good home lighting?: yes Regular seat belt use?: yes Hospital stays in the last year:: no  Cognitive Assessment Difficulty concentrating, remembering, or making decisions? : no Will 6CIT or Mini Cog be Completed: yes What year is it?: 0 points What month is it?: 0 points Give patient an address phrase to remember (5 components): [address] [place], [place] About what time is it?: 0 points Count backwards from 20 to 1: 2 points (4) Say the months of the year in reverse: 0 points Repeat the address phrase from earlier: 2 points (Drive) 6 CIT Score: 4 points  Advance Directives (For Healthcare) Does Patient Have a Medical Advance Directive?: Yes Does patient want to make changes to medical advance directive?: Yes (Inpatient - patient requests chaplain consult to change a medical advance directive) Type of Advance Directive: Healthcare Power of [place]; Living will Copy of Healthcare Power of Attorney in Chart?: No - copy requested Copy of Living Will in Chart?: No - copy requested  Reviewed/Updated  Reviewed/Updated: Reviewed All (Medical, Surgical, Family, Medications, Allergies, Care Teams, Patient Goals)    Allergies (verified) Patient has no known allergies.   Current Medications (verified) Outpatient Encounter Medications as of 11/21/2024  Medication Sig   B Complex Vitamins (B-COMPLEX/B-12 PO) Take by mouth.   lisinopril  (ZESTRIL ) 20 MG tablet TAKE 1 TABLET BY MOUTH EVERY DAY   Magnesium 100 MG CAPS Take by mouth.   Potassium 99 MG TABS Take by mouth.   sildenafil  (REVATIO ) 20 MG tablet TAKE 1 TO 2 TABLETS BY  MOUTH DAILY 1 HOUR PRIOR TO SEXUAL ACTIVITY FOR MAXIMAL EFFECTIVENESS   valACYclovir  (VALTREX ) 1000 MG tablet Take 1 tablet (1,000 mg total) by mouth as  needed.   No facility-administered encounter medications on file as of 11/21/2024.    History: Past Medical History:  Diagnosis Date   Arthritis    R kneee   Genital herpes    GERD (gastroesophageal reflux disease)    Hypertension    Past Surgical History:  Procedure Laterality Date   VASECTOMY     Family History  Problem Relation Age of Onset   Breast cancer Mother    Cancer Mother    Cancer Father    COPD Brother    Lung cancer Brother    Colon polyps Neg Hx    Crohn's disease Neg Hx    Esophageal cancer Neg Hx    Rectal cancer Neg Hx    Stomach cancer Neg Hx    Social History   Occupational History   Not on file  Tobacco Use   Smoking status: Never   Smokeless tobacco: Never  Vaping Use   Vaping status: Never Used  Substance and Sexual Activity   Alcohol use: Yes    Alcohol/week: 1.0 standard drink of alcohol    Types: 1 Glasses of wine per week    Comment: occ   Drug use: No   Sexual activity: Yes   Tobacco Counseling Counseling given: No  SDOH Screenings   Food Insecurity: No Food Insecurity (11/21/2024)  Housing: Low Risk (11/21/2024)  Transportation Needs: No Transportation Needs (11/21/2024)  Utilities: Not At Risk (11/21/2024)  Alcohol Screen: Low Risk (11/21/2024)  Depression (PHQ2-9): Low Risk (11/21/2024)  Financial Resource Strain: Low Risk (11/21/2024)  Physical Activity: Sufficiently Active (11/21/2024)  Social Connections: Moderately Integrated (11/21/2024)  Stress: No Stress Concern Present (11/21/2024)  Tobacco Use: Low Risk (11/21/2024)  Health Literacy: Adequate Health Literacy (11/21/2024)   See flowsheets for full screening details  Depression Screen PHQ 2 & 9 Depression Scale- Over the past 2 weeks, how often have you been bothered by any of the following problems? Little interest or pleasure in doing things: 0 Feeling down, depressed, or hopeless (PHQ Adolescent also includes...irritable): 0 PHQ-2 Total Score: 0 Trouble  falling or staying asleep, or sleeping too much: 0 Feeling tired or having little energy: 0 Poor appetite or overeating (PHQ Adolescent also includes...weight loss): 0 Feeling bad about yourself - or that you are a failure or have let yourself or your family down: 0 Trouble concentrating on things, such as reading the newspaper or watching television (PHQ Adolescent also includes...like school work): 0 Moving or speaking so slowly that other people could have noticed. Or the opposite - being so fidgety or restless that you have been moving around a lot more than usual: 0 Thoughts that you would be better off dead, or of hurting yourself in some way: 0 PHQ-9 Total Score: 0 If you checked off any problems, how difficult have these problems made it for you to do your work, take care of things at home, or get along with other people?: Not difficult at all  Depression Treatment Depression Interventions/Treatment : EYV7-0 Score <4 Follow-up Not Indicated     Goals Addressed               This Visit's Progress     Patient Stated (pt-stated)        Patient stated he plans to continue being active and monitoring your bp readings  Objective:    Today's Vitals   11/21/24 1306  BP: 132/78  Pulse: 78  SpO2: 98%  Weight: 160 lb (72.6 kg)  Height: 5' 4 (1.626 m)   Body mass index is 27.46 kg/m.  Hearing/Vision screen Hearing Screening - Comments:: Referral to Audiology Vision Screening - Comments:: Wears rx glasses - referral to [facility]  Immunizations and Health Maintenance Health Maintenance  Topic Date Due   Zoster Vaccines- Shingrix (2 of 2) 11/05/2017   COVID-19 Vaccine (5 - 2025-26 season) 08/04/2024   Medicare Annual Wellness (AWV)  11/21/2025   DTaP/Tdap/Td (2 - Td or Tdap) 08/29/2029   Pneumococcal Vaccine: 50+ Years  Completed   Influenza Vaccine  Completed   Hepatitis C Screening  Completed   Meningococcal B Vaccine  Aged Out    Colonoscopy  Discontinued        Assessment/Plan:  This is a routine wellness examination for Ioane.  Patient Care Team: Purcell Emil Schanz, MD as PCP - General (Internal Medicine) Patel, Donika K, DO as Consulting Physician (Neurology)  I have personally reviewed and noted the following in the patients chart:   Medical and social history Use of alcohol, tobacco or illicit drugs  Current medications and supplements including opioid prescriptions. Functional ability and status Nutritional status Physical activity Advanced directives List of other physicians Hospitalizations, surgeries, and ER visits in previous 12 months Vitals Screenings to include cognitive, depression, and falls Referrals and appointments  Orders Placed This Encounter  Procedures   Ambulatory referral to Audiology    Referral Priority:   Routine    Referral Type:   Audiology Exam    Referral Reason:   Specialty Services Required    Referred to Provider:   Helane Darryle LABOR, AUD    Number of Visits Requested:   1   In addition, I have reviewed and discussed with patient certain preventive protocols, quality metrics, and best practice recommendations. A written personalized care plan for preventive services as well as general preventive health recommendations were provided to patient.   Verdie CHRISTELLA Saba, CMA   11/21/2024   Return in 1 year (on 11/21/2025).  After Visit Summary: (In Person-Declined) Patient declined AVS at this time.  Nurse Notes: scheduled CPE for 12/2024 w/PCP. "

## 2024-11-29 ENCOUNTER — Other Ambulatory Visit: Payer: Self-pay | Admitting: Emergency Medicine

## 2024-11-29 DIAGNOSIS — I10 Essential (primary) hypertension: Secondary | ICD-10-CM

## 2024-12-16 ENCOUNTER — Other Ambulatory Visit: Payer: Self-pay | Admitting: Urology

## 2024-12-23 ENCOUNTER — Other Ambulatory Visit: Payer: Self-pay | Admitting: Emergency Medicine

## 2024-12-23 ENCOUNTER — Ambulatory Visit: Payer: PRIVATE HEALTH INSURANCE | Admitting: Emergency Medicine

## 2024-12-23 ENCOUNTER — Ambulatory Visit: Payer: Self-pay | Admitting: Emergency Medicine

## 2024-12-23 ENCOUNTER — Encounter: Payer: Self-pay | Admitting: Emergency Medicine

## 2024-12-23 VITALS — BP 128/82 | HR 72 | Temp 97.9°F | Ht 64.0 in | Wt 161.0 lb

## 2024-12-23 DIAGNOSIS — Z1329 Encounter for screening for other suspected endocrine disorder: Secondary | ICD-10-CM

## 2024-12-23 DIAGNOSIS — I10 Essential (primary) hypertension: Secondary | ICD-10-CM

## 2024-12-23 DIAGNOSIS — E785 Hyperlipidemia, unspecified: Secondary | ICD-10-CM

## 2024-12-23 DIAGNOSIS — Z130 Encounter for screening for diseases of the blood and blood-forming organs and certain disorders involving the immune mechanism: Secondary | ICD-10-CM | POA: Diagnosis not present

## 2024-12-23 DIAGNOSIS — Z13220 Encounter for screening for lipoid disorders: Secondary | ICD-10-CM

## 2024-12-23 DIAGNOSIS — Z13228 Encounter for screening for other metabolic disorders: Secondary | ICD-10-CM | POA: Diagnosis not present

## 2024-12-23 DIAGNOSIS — Z125 Encounter for screening for malignant neoplasm of prostate: Secondary | ICD-10-CM

## 2024-12-23 DIAGNOSIS — Z0000 Encounter for general adult medical examination without abnormal findings: Secondary | ICD-10-CM

## 2024-12-23 LAB — COMPREHENSIVE METABOLIC PANEL WITH GFR
ALT: 13 U/L (ref 3–53)
AST: 20 U/L (ref 5–37)
Albumin: 4.4 g/dL (ref 3.5–5.2)
Alkaline Phosphatase: 41 U/L (ref 39–117)
BUN: 15 mg/dL (ref 6–23)
CO2: 30 meq/L (ref 19–32)
Calcium: 9.9 mg/dL (ref 8.4–10.5)
Chloride: 102 meq/L (ref 96–112)
Creatinine, Ser: 1.07 mg/dL (ref 0.40–1.50)
GFR: 68.94 mL/min
Glucose, Bld: 95 mg/dL (ref 70–99)
Potassium: 4.8 meq/L (ref 3.5–5.1)
Sodium: 140 meq/L (ref 135–145)
Total Bilirubin: 0.7 mg/dL (ref 0.2–1.2)
Total Protein: 7.6 g/dL (ref 6.0–8.3)

## 2024-12-23 LAB — CBC WITH DIFFERENTIAL/PLATELET
Basophils Absolute: 0 K/uL (ref 0.0–0.1)
Basophils Relative: 1.2 % (ref 0.0–3.0)
Eosinophils Absolute: 0.1 K/uL (ref 0.0–0.7)
Eosinophils Relative: 2.1 % (ref 0.0–5.0)
HCT: 43.2 % (ref 39.0–52.0)
Hemoglobin: 14.7 g/dL (ref 13.0–17.0)
Lymphocytes Relative: 32.9 % (ref 12.0–46.0)
Lymphs Abs: 1.4 K/uL (ref 0.7–4.0)
MCHC: 34 g/dL (ref 30.0–36.0)
MCV: 100 fl (ref 78.0–100.0)
Monocytes Absolute: 0.5 K/uL (ref 0.1–1.0)
Monocytes Relative: 11.7 % (ref 3.0–12.0)
Neutro Abs: 2.2 K/uL (ref 1.4–7.7)
Neutrophils Relative %: 52.1 % (ref 43.0–77.0)
Platelets: 233 K/uL (ref 150.0–400.0)
RBC: 4.32 Mil/uL (ref 4.22–5.81)
RDW: 14.1 % (ref 11.5–15.5)
WBC: 4.2 K/uL (ref 4.0–10.5)

## 2024-12-23 LAB — LIPID PANEL
Cholesterol: 227 mg/dL — ABNORMAL HIGH (ref 28–200)
HDL: 89.5 mg/dL
LDL Cholesterol: 125 mg/dL — ABNORMAL HIGH (ref 10–99)
NonHDL: 137.26
Total CHOL/HDL Ratio: 3
Triglycerides: 61 mg/dL (ref 10.0–149.0)
VLDL: 12.2 mg/dL (ref 0.0–40.0)

## 2024-12-23 LAB — PSA: PSA: 2.67 ng/mL (ref 0.10–4.00)

## 2024-12-23 LAB — HEMOGLOBIN A1C: Hgb A1c MFr Bld: 5.7 % (ref 4.6–6.5)

## 2024-12-23 MED ORDER — ROSUVASTATIN CALCIUM 10 MG PO TABS: 10.0000 mg | ORAL_TABLET | Freq: Every day | ORAL | 3 refills | Status: AC

## 2024-12-23 NOTE — Patient Instructions (Signed)
 Health Maintenance, Male  Adopting a healthy lifestyle and getting preventive care are important in promoting health and wellness. Ask your health care provider about:  The right schedule for you to have regular tests and exams.  Things you can do on your own to prevent diseases and keep yourself healthy.  What should I know about diet, weight, and exercise?  Eat a healthy diet    Eat a diet that includes plenty of vegetables, fruits, low-fat dairy products, and lean protein.  Do not eat a lot of foods that are high in solid fats, added sugars, or sodium.  Maintain a healthy weight  Body mass index (BMI) is a measurement that can be used to identify possible weight problems. It estimates body fat based on height and weight. Your health care provider can help determine your BMI and help you achieve or maintain a healthy weight.  Get regular exercise  Get regular exercise. This is one of the most important things you can do for your health. Most adults should:  Exercise for at least 150 minutes each week. The exercise should increase your heart rate and make you sweat (moderate-intensity exercise).  Do strengthening exercises at least twice a week. This is in addition to the moderate-intensity exercise.  Spend less time sitting. Even light physical activity can be beneficial.  Watch cholesterol and blood lipids  Have your blood tested for lipids and cholesterol at 74 years of age, then have this test every 5 years.  You may need to have your cholesterol levels checked more often if:  Your lipid or cholesterol levels are high.  You are older than 74 years of age.  You are at high risk for heart disease.  What should I know about cancer screening?  Many types of cancers can be detected early and may often be prevented. Depending on your health history and family history, you may need to have cancer screening at various ages. This may include screening for:  Colorectal cancer.  Prostate cancer.  Skin cancer.  Lung  cancer.  What should I know about heart disease, diabetes, and high blood pressure?  Blood pressure and heart disease  High blood pressure causes heart disease and increases the risk of stroke. This is more likely to develop in people who have high blood pressure readings or are overweight.  Talk with your health care provider about your target blood pressure readings.  Have your blood pressure checked:  Every 3-5 years if you are 24-52 years of age.  Every year if you are 3 years old or older.  If you are between the ages of 60 and 72 and are a current or former smoker, ask your health care provider if you should have a one-time screening for abdominal aortic aneurysm (AAA).  Diabetes  Have regular diabetes screenings. This checks your fasting blood sugar level. Have the screening done:  Once every three years after age 66 if you are at a normal weight and have a low risk for diabetes.  More often and at a younger age if you are overweight or have a high risk for diabetes.  What should I know about preventing infection?  Hepatitis B  If you have a higher risk for hepatitis B, you should be screened for this virus. Talk with your health care provider to find out if you are at risk for hepatitis B infection.  Hepatitis C  Blood testing is recommended for:  Everyone born from [birth year] through 1965.  Anyone  with known risk factors for hepatitis C.  Sexually transmitted infections (STIs)  You should be screened each year for STIs, including gonorrhea and chlamydia, if:  You are sexually active and are younger than 74 years of age.  You are older than 74 years of age and your health care provider tells you that you are at risk for this type of infection.  Your sexual activity has changed since you were last screened, and you are at increased risk for chlamydia or gonorrhea. Ask your health care provider if you are at risk.  Ask your health care provider about whether you are at high risk for HIV. Your health care provider  may recommend a prescription medicine to help prevent HIV infection. If you choose to take medicine to prevent HIV, you should first get tested for HIV. You should then be tested every 3 months for as long as you are taking the medicine.  Follow these instructions at home:  Alcohol use  Do not drink alcohol if your health care provider tells you not to drink.  If you drink alcohol:  Limit how much you have to 0-2 drinks a day.  Know how much alcohol is in your drink. In the [place] one drink equals one 12 oz bottle of beer (355 mL), one 5 oz glass of wine (148 mL), or one 1 oz glass of hard liquor (44 mL).  Lifestyle  Do not use any products that contain nicotine or tobacco. These products include cigarettes, chewing tobacco, and vaping devices, such as e-cigarettes. If you need help quitting, ask your health care provider.  Do not use street drugs.  Do not share needles.  Ask your health care provider for help if you need support or information about quitting drugs.  General instructions  Schedule regular health, dental, and eye exams.  Stay current with your vaccines.  Tell your health care provider if:  You often feel depressed.  You have ever been abused or do not feel safe at home.  Summary  Adopting a healthy lifestyle and getting preventive care are important in promoting health and wellness.  Follow your health care provider's instructions about healthy diet, exercising, and getting tested or screened for diseases.  Follow your health care provider's instructions on monitoring your cholesterol and blood pressure.  This information is not intended to replace advice given to you by your health care provider. Make sure you discuss any questions you have with your health care provider.  Document Revised: 04/11/2021 Document Reviewed: 04/11/2021  Elsevier Patient Education  2024 [employer].

## 2024-12-23 NOTE — Assessment & Plan Note (Signed)
 BP Readings from Last 3 Encounters:  12/23/24 128/82  11/21/24 132/78  01/25/24 127/86  Well-controlled hypertension Continue lisinopril  20 mg daily Cardiovascular risks associated with hypertension discussed Dietary approaches to stop hypertension discussed

## 2024-12-23 NOTE — Telephone Encounter (Signed)
 New prescription for rosuvastatin  sent to pharmacy of record today.  Thanks.

## 2024-12-23 NOTE — Progress Notes (Signed)
 Daniel Lamb 74 y.o.   Chief Complaint  Patient presents with   Annual Exam    HISTORY OF PRESENT ILLNESS: This is a 74 y.o. male here for annual exam. History of hypertension. Overall doing well.  Has no complaints or medical concerns today.  HPI   Prior to Admission medications  Medication Sig Start Date End Date Taking? Authorizing Provider  B Complex Vitamins (B-COMPLEX/B-12 PO) Take by mouth.   Yes [provider]  lisinopril  (ZESTRIL ) 20 MG tablet TAKE 1 TABLET BY MOUTH EVERY DAY 11/29/24  Yes Webb, Padonda B, FNP  Magnesium 100 MG CAPS Take by mouth.   Yes [provider]  Potassium 99 MG TABS Take by mouth.   Yes [provider]  sildenafil  (REVATIO ) 20 MG tablet TAKE 1 TO 2 TABLETS BY MOUTH DAILY 1 HOUR PRIOR TO SEXUAL ACTIVITY FOR MAXIMAL EFFECTIVENESS 10/01/24  Yes Bing Duffey, Emil Schanz, MD  valACYclovir  (VALTREX ) 1000 MG tablet Take 1 tablet (1,000 mg total) by mouth as needed. 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, left 02/14/2022   Dupuytren's contracture of left hand 05/18/2021   Osteoarthritis of both hands 03/30/2021   Essential hypertension 07/09/2018   Erectile dysfunction 07/09/2018   History of herpes genitalis 06/28/2015    Past Medical History:  Diagnosis Date   Arthritis    R kneee   Genital herpes    GERD (gastroesophageal reflux disease)    Hypertension     Past Surgical History:  Procedure Laterality Date   VASECTOMY      Social History   Socioeconomic History   Marital status: Married    Spouse name: Not on file   Number of children: Not on file   Years of education: Not on file   Highest education level: Associate degree: occupational, scientist, product/process development, or vocational program  Occupational History   Not on file  Tobacco Use   Smoking status: Never   Smokeless tobacco: Never  Vaping Use   Vaping  status: Never Used  Substance and Sexual Activity   Alcohol use: Yes    Alcohol/week: 1.0 standard drink of alcohol    Types: 1 Glasses of wine per week    Comment: occ   Drug use: No   Sexual activity: Yes  Other Topics Concern   Not on file  Social History Narrative   Right handed    Social Drivers of Health   Tobacco Use: Low Risk (12/23/2024)   Patient History    Smoking Tobacco Use: Never    Smokeless Tobacco Use: Never    Passive Exposure: Not on file  Financial Resource Strain: Low Risk (11/21/2024)   Overall Financial Resource Strain (CARDIA)    Difficulty of Paying Living Expenses: Not hard at all  Food Insecurity: No Food Insecurity (11/21/2024)   Epic    Worried About Radiation Protection Practitioner of Food in the Last Year: Never true    Ran Out of Food in the Last Year: Never true  Transportation Needs: No Transportation Needs (11/21/2024)   Epic    Lack of Transportation (Medical): No    Lack of Transportation (Non-Medical): No  Physical Activity: Sufficiently Active (11/21/2024)   Exercise Vital Sign    Days of Exercise per Week: 7 days    Minutes of Exercise per Session: 140 min  Stress: No Stress Concern Present (11/21/2024)   [employer] of  Occupational Health - Occupational Stress Questionnaire    Feeling of Stress: Only a little  Social Connections: Moderately Integrated (11/21/2024)   Social Connection and Isolation Panel    Frequency of Communication with Friends and Family: More than three times a week    Frequency of Social Gatherings with Friends and Family: Once a week    Attends Religious Services: More than 4 times per year    Active Member of [employer] or Organizations: No    Attends Banker Meetings: Never    Marital Status: Married  Catering Manager Violence: Not At Risk (11/21/2024)   Epic    Fear of Current or Ex-Partner: No    Emotionally Abused: No    Physically Abused: No    Sexually Abused: No  Depression (PHQ2-9): Low Risk  (12/23/2024)   Depression (PHQ2-9)    PHQ-2 Score: 0  Alcohol Screen: Low Risk (11/21/2024)   Alcohol Screen    Last Alcohol Screening Score (AUDIT): 4  Housing: Low Risk (11/21/2024)   Epic    Unable to Pay for Housing in the Last Year: No    Number of Times Moved in the Last Year: 0    Homeless in the Last Year: No  Utilities: Not At Risk (11/21/2024)   Epic    Threatened with loss of utilities: No  Health Literacy: Adequate Health Literacy (11/21/2024)   B1300 Health Literacy    Frequency of need for help with medical instructions: Never    Family History  Problem Relation Age of Onset   Breast cancer Mother    Cancer Mother    Cancer Father    COPD Brother    Lung cancer Brother    Colon polyps Neg Hx    Crohn's disease Neg Hx    Esophageal cancer Neg Hx    Rectal cancer Neg Hx    Stomach cancer Neg Hx      Review of Systems  Constitutional: Negative.  Negative for chills and fever.  HENT: Negative.  Negative for congestion and sore throat.   Respiratory: Negative.  Negative for cough and shortness of breath.   Cardiovascular: Negative.  Negative for chest pain and palpitations.  Gastrointestinal:  Negative for abdominal pain, diarrhea, nausea and vomiting.  Genitourinary: Negative.  Negative for dysuria and hematuria.  Skin: Negative.  Negative for rash.  Neurological: Negative.  Negative for dizziness and headaches.  All other systems reviewed and are negative.   Vitals:   12/23/24 0842  BP: 128/82  Pulse: 72  Temp: 97.9 F (36.6 C)  SpO2: 97%    Physical Exam Vitals reviewed.  Constitutional:      Appearance: Normal appearance.  HENT:     Head: Normocephalic.     Right Ear: Tympanic membrane, ear canal and external ear normal.     Left Ear: Tympanic membrane, ear canal and external ear normal.     Mouth/Throat:     Mouth: Mucous membranes are moist.     Pharynx: Oropharynx is clear.  Eyes:     Extraocular Movements: Extraocular movements  intact.     Conjunctiva/sclera: Conjunctivae normal.     Pupils: Pupils are equal, round, and reactive to light.  Cardiovascular:     Rate and Rhythm: Normal rate and regular rhythm.     Pulses: Normal pulses.     Heart sounds: Normal heart sounds.  Pulmonary:     Effort: Pulmonary effort is normal.     Breath sounds: Normal breath sounds.  Abdominal:  Palpations: Abdomen is soft.     Tenderness: There is no abdominal tenderness.  Musculoskeletal:     Cervical back: No tenderness.     Right lower leg: No edema.     Left lower leg: No edema.  Lymphadenopathy:     Cervical: No cervical adenopathy.  Skin:    General: Skin is warm and dry.     Capillary Refill: Capillary refill takes less than 2 seconds.  Neurological:     General: No focal deficit present.     Mental Status: He is alert and oriented to person, place, and time.  Psychiatric:        Mood and Affect: Mood normal.        Behavior: Behavior normal.      ASSESSMENT & PLAN: Problem List Items Addressed This Visit       Cardiovascular and Mediastinum   Essential hypertension   BP Readings from Last 3 Encounters:  12/23/24 128/82  11/21/24 132/78  01/25/24 127/86  Well-controlled hypertension Continue lisinopril  20 mg daily Cardiovascular risks associated with hypertension discussed Dietary approaches to stop hypertension discussed       Relevant Orders   CBC with Differential/Platelet   Comprehensive metabolic panel with GFR   Hemoglobin A1c   Lipid panel   Other Visit Diagnoses       Routine general medical examination at a health care facility    -  Primary   Relevant Orders   CBC with Differential/Platelet   Comprehensive metabolic panel with GFR   Hemoglobin A1c   Lipid panel     Screening for prostate cancer       Relevant Orders   PSA     Screening for deficiency anemia       Relevant Orders   CBC with Differential/Platelet     Screening for lipoid disorders       Relevant Orders    Lipid panel     Screening for endocrine, metabolic and immunity disorder       Relevant Orders   Comprehensive metabolic panel with GFR   Hemoglobin A1c      Modifiable risk factors discussed with patient. Anticipatory guidance according to age provided. The following topics were also discussed: Social Determinants of Health Smoking.  Non-smoker Diet and nutrition Benefits of exercise Cancer screening and review of colonoscopy report from 2025 Vaccinations review and recommendations Cardiovascular risk assessment Mental health including depression and anxiety Fall and accident prevention  Patient Instructions  Health Maintenance, Male Adopting a healthy lifestyle and getting preventive care are important in promoting health and wellness. Ask your health care provider about: The right schedule for you to have regular tests and exams. Things you can do on your own to prevent diseases and keep yourself healthy. What should I know about diet, weight, and exercise? Eat a healthy diet  Eat a diet that includes plenty of vegetables, fruits, low-fat dairy products, and lean protein. Do not eat a lot of foods that are high in solid fats, added sugars, or sodium. Maintain a healthy weight Body mass index (BMI) is a measurement that can be used to identify possible weight problems. It estimates body fat based on height and weight. Your health care provider can help determine your BMI and help you achieve or maintain a healthy weight. Get regular exercise Get regular exercise. This is one of the most important things you can do for your health. Most adults should: Exercise for at least 150 minutes each week. The  exercise should increase your heart rate and make you sweat (moderate-intensity exercise). Do strengthening exercises at least twice a week. This is in addition to the moderate-intensity exercise. Spend less time sitting. Even light physical activity can be beneficial. Watch  cholesterol and blood lipids Have your blood tested for lipids and cholesterol at 74 years of age, then have this test every 5 years. You may need to have your cholesterol levels checked more often if: Your lipid or cholesterol levels are high. You are older than 74 years of age. You are at high risk for heart disease. What should I know about cancer screening? Many types of cancers can be detected early and may often be prevented. Depending on your health history and family history, you may need to have cancer screening at various ages. This may include screening for: Colorectal cancer. Prostate cancer. Skin cancer. Lung cancer. What should I know about heart disease, diabetes, and high blood pressure? Blood pressure and heart disease High blood pressure causes heart disease and increases the risk of stroke. This is more likely to develop in people who have high blood pressure readings or are overweight. Talk with your health care provider about your target blood pressure readings. Have your blood pressure checked: Every 3-5 years if you are 70-35 years of age. Every year if you are 41 years old or older. If you are between the ages of 51 and 47 and are a current or former smoker, ask your health care provider if you should have a one-time screening for abdominal aortic aneurysm (AAA). Diabetes Have regular diabetes screenings. This checks your fasting blood sugar level. Have the screening done: Once every three years after age 71 if you are at a normal weight and have a low risk for diabetes. More often and at a younger age if you are overweight or have a high risk for diabetes. What should I know about preventing infection? Hepatitis B If you have a higher risk for hepatitis B, you should be screened for this virus. Talk with your health care provider to find out if you are at risk for hepatitis B infection. Hepatitis C Blood testing is recommended for: Everyone born from [birth year] through  1965. Anyone with known risk factors for hepatitis C. Sexually transmitted infections (STIs) You should be screened each year for STIs, including gonorrhea and chlamydia, if: You are sexually active and are younger than 74 years of age. You are older than 74 years of age and your health care provider tells you that you are at risk for this type of infection. Your sexual activity has changed since you were last screened, and you are at increased risk for chlamydia or gonorrhea. Ask your health care provider if you are at risk. Ask your health care provider about whether you are at high risk for HIV. Your health care provider may recommend a prescription medicine to help prevent HIV infection. If you choose to take medicine to prevent HIV, you should first get tested for HIV. You should then be tested every 3 months for as long as you are taking the medicine. Follow these instructions at home: Alcohol use Do not drink alcohol if your health care provider tells you not to drink. If you drink alcohol: Limit how much you have to 0-2 drinks a day. Know how much alcohol is in your drink. In the [place] one drink equals one 12 oz bottle of beer (355 mL), one 5 oz glass of wine (148 mL), or one  1 oz glass of hard liquor (44 mL). Lifestyle Do not use any products that contain nicotine or tobacco. These products include cigarettes, chewing tobacco, and vaping devices, such as e-cigarettes. If you need help quitting, ask your health care provider. Do not use street drugs. Do not share needles. Ask your health care provider for help if you need support or information about quitting drugs. General instructions Schedule regular health, dental, and eye exams. Stay current with your vaccines. Tell your health care provider if: You often feel depressed. You have ever been abused or do not feel safe at home. Summary Adopting a healthy lifestyle and getting preventive care are important in promoting health and  wellness. Follow your health care provider's instructions about healthy diet, exercising, and getting tested or screened for diseases. Follow your health care provider's instructions on monitoring your cholesterol and blood pressure. This information is not intended to replace advice given to you by your health care provider. Make sure you discuss any questions you have with your health care provider. Document Revised: 04/11/2021 Document Reviewed: 04/11/2021 Elsevier Patient Education  2024 Elsevier Inc[employer]      Emil Schaumann, MD [facility]    [1] No Known Allergies

## 2024-12-31 NOTE — Patient Instructions (Addendum)
 SURGICAL WAITING ROOM VISITATION  Patients having surgery or a procedure may have no more than 2 support people in the waiting area - these visitors may rotate.    Children ages 101 and under will not be able to visit patients in [facility] under most circumstances.   Visitors with respiratory illnesses are discouraged from visiting and should remain at home.  If the patient needs to stay at the hospital during part of their recovery, the visitor guidelines for inpatient rooms apply. Pre-op nurse will coordinate an appropriate time for 1 support person to accompany patient in pre-op.  This support person may not rotate.    Please refer to the [facility] website for the visitor guidelines for Inpatients (after your surgery is over and you are in a regular room).    Your procedure is scheduled on: 01/13/25   Report to [facility] Main Entrance    Report to admitting at 5:15 AM   Call this number if you have problems the morning of surgery [phone]   Do not eat food or drink liquids :After Midnight.          If you have questions, please contact your surgeons office.   FOLLOW BOWEL PREP AND ANY ADDITIONAL PRE OP INSTRUCTIONS YOU RECEIVED FROM YOUR SURGEON'S OFFICE!!!     Oral Hygiene is also important to reduce your risk of infection.                                    Remember - BRUSH YOUR TEETH THE MORNING OF SURGERY WITH YOUR REGULAR TOOTHPASTE  DENTURES WILL BE REMOVED PRIOR TO SURGERY PLEASE DO NOT APPLY Poly grip OR ADHESIVES!!!   Stop all vitamins and herbal supplements 7 days before surgery.   Take these medicines the morning of surgery with A SIP OF WATER: Rosuvastatin  (Crestor )                              You may not have any metal on your body including jewelry, and body piercing             Do not wear lotions, powders, cologne, or deodorant              Men may shave face and neck.   Do not bring valuables to the hospital. [facility] IS  NOT             RESPONSIBLE   FOR VALUABLES.   Contacts, glasses, dentures or bridgework may not be worn into surgery.  DO NOT BRING YOUR HOME MEDICATIONS TO THE HOSPITAL. PHARMACY WILL DISPENSE MEDICATIONS LISTED ON YOUR MEDICATION LIST TO YOU DURING YOUR ADMISSION IN THE HOSPITAL!    Patients discharged on the day of surgery will not be allowed to drive home.  Someone NEEDS to stay with you for the first 24 hours after anesthesia.   Special Instructions: Bring a copy of your healthcare power of attorney and living will documents the day of surgery if you haven't scanned them before.              Please read over the following fact sheets you were given: IF YOU HAVE QUESTIONS ABOUT YOUR PRE-OP INSTRUCTIONS PLEASE CALL [phone].   If you received a COVID test during your pre-op visit  it is requested that you wear a mask when out  in public, stay away from anyone that may not be feeling well and notify your surgeon if you develop symptoms. If you test positive for Covid or have been in contact with anyone that has tested positive in the last 10 days please notify you surgeon.    [facility] - Preparing for Surgery Before surgery, you can play an important role.  Because skin is not sterile, your skin needs to be as free of germs as possible.  You can reduce the number of germs on your skin by washing with CHG (chlorahexidine gluconate) soap before surgery.  CHG is an antiseptic cleaner which kills germs and bonds with the skin to continue killing germs even after washing. Please DO NOT use if you have an allergy to CHG or antibacterial soaps.  If your skin becomes reddened/irritated stop using the CHG and inform your nurse when you arrive at Short Stay. Do not shave (including legs and underarms) for at least 48 hours prior to the first CHG shower.  You may shave your face/neck.  Please follow these instructions carefully:  1.  Shower with CHG Soap the night before surgery ONLY (DO  NOT USE THE SOAP THE MORNING OF SURGERY).  2.  If you choose to wash your hair, wash your hair first as usual with your normal  shampoo.  3.  After you shampoo, rinse your hair and body thoroughly to remove the shampoo.                             4.  Use CHG as you would any other liquid soap.  You can apply chg directly to the skin and wash.  Gently with a scrungie or clean washcloth.  5.  Apply the CHG Soap to your body ONLY FROM THE NECK DOWN.   Do   not use on face/ open                           Wound or open sores. Avoid contact with eyes, ears mouth and   genitals (private parts).                       Wash face,  Genitals (private parts) with your normal soap.             6.  Wash thoroughly, paying special attention to the area where your    surgery  will be performed.  7.  Thoroughly rinse your body with warm water from the neck down.  8.  DO NOT shower/wash with your normal soap after using and rinsing off the CHG Soap.                9.  Pat yourself dry with a clean towel.            10.  Wear clean pajamas.            11.  Place clean sheets on your bed the night of your first shower and do not  sleep with pets. Day of Surgery : Do not apply any CHG, lotions/deodorants the morning of surgery.  Please wear clean clothes to the hospital/surgery center.  FAILURE TO FOLLOW THESE INSTRUCTIONS MAY RESULT IN THE CANCELLATION OF YOUR SURGERY  PATIENT SIGNATURE_________________________________  NURSE SIGNATURE__________________________________  ________________________________________________________________________

## 2024-12-31 NOTE — Progress Notes (Signed)
 Date of COVID positive in last 90 days:  PCP - Emil Schaumann, MD Cardiologist - n/a  Chest x-ray - N/A EKG - 01/01/25 Epic/chart Stress Test - N/A ECHO - N/A Cardiac Cath - N/A Pacemaker/ICD device last checked:N/A Spinal Cord Stimulator:N/A  Bowel Prep - N/A  Sleep Study - N/A CPAP -   Fasting Blood Sugar - N/A Checks Blood Sugar _____ times a day  Last dose of GLP1 agonist-  N/A GLP1 instructions:  Do not take after     Last dose of SGLT-2 inhibitors-  N/A SGLT-2 instructions:  Do not take after     Blood Thinner Instructions: N/A Last dose:   Time: Aspirin Instructions:N/A Last Dose:  Activity level: Can go up a flight of stairs and perform activities of daily living without stopping and without symptoms of chest pain or shortness of breath.  Anesthesia review: N/A  Patient denies shortness of breath, fever, cough and chest pain at PAT appointment  Patient verbalized understanding of instructions that were given to them at the PAT appointment. Patient was also instructed that they will need to review over the PAT instructions again at home before surgery.

## 2025-01-01 ENCOUNTER — Other Ambulatory Visit: Payer: Self-pay

## 2025-01-01 ENCOUNTER — Encounter (HOSPITAL_COMMUNITY): Payer: Self-pay

## 2025-01-01 ENCOUNTER — Encounter (HOSPITAL_COMMUNITY)
Admission: RE | Admit: 2025-01-01 | Discharge: 2025-01-01 | Disposition: A | Discharge status: Home / Self Care | Source: Ambulatory Visit | Attending: Urology | Admitting: Urology

## 2025-01-01 VITALS — BP 134/86 | HR 67 | Temp 98.1°F | Resp 16 | Ht 65.0 in | Wt 158.0 lb

## 2025-01-01 DIAGNOSIS — Z01818 Encounter for other preprocedural examination: Secondary | ICD-10-CM | POA: Diagnosis present

## 2025-01-01 DIAGNOSIS — I10 Essential (primary) hypertension: Secondary | ICD-10-CM | POA: Diagnosis not present

## 2025-01-01 DIAGNOSIS — R9431 Abnormal electrocardiogram [ECG] [EKG]: Secondary | ICD-10-CM | POA: Diagnosis not present

## 2025-01-01 HISTORY — DX: Personal history of urinary calculi: Z87.442

## 2025-01-01 HISTORY — DX: Pure hypercholesterolemia, unspecified: E78.00

## 2025-01-03 LAB — URINE CULTURE: Culture: NO GROWTH

## 2025-01-13 ENCOUNTER — Encounter (HOSPITAL_COMMUNITY): Admission: RE | Payer: Self-pay | Source: Ambulatory Visit

## 2025-01-13 ENCOUNTER — Ambulatory Visit (HOSPITAL_COMMUNITY): Admission: RE | Admit: 2025-01-13 | Payer: PRIVATE HEALTH INSURANCE | Source: Ambulatory Visit | Admitting: Urology

## 2025-01-13 SURGERY — INSERTION, PENILE PROSTHESIS, INFLATABLE
Anesthesia: General

## 2025-01-15 ENCOUNTER — Ambulatory Visit: Admitting: Audiology

## 2025-11-26 ENCOUNTER — Ambulatory Visit: Payer: PRIVATE HEALTH INSURANCE
